# Patient Record
Sex: Male | Born: 1965 | Race: Black or African American | Hispanic: No | Smoking: Never smoker
Health system: Southern US, Community
[De-identification: ages and names within clinical notes are randomized; demographics above are authoritative.]

## PROBLEM LIST (undated history)

## (undated) DIAGNOSIS — J3089 Other allergic rhinitis: Secondary | ICD-10-CM

## (undated) DIAGNOSIS — R519 Headache, unspecified: Secondary | ICD-10-CM

## (undated) DIAGNOSIS — K759 Inflammatory liver disease, unspecified: Secondary | ICD-10-CM

## (undated) DIAGNOSIS — I10 Essential (primary) hypertension: Secondary | ICD-10-CM

## (undated) DIAGNOSIS — F419 Anxiety disorder, unspecified: Secondary | ICD-10-CM

## (undated) DIAGNOSIS — Z973 Presence of spectacles and contact lenses: Secondary | ICD-10-CM

## (undated) DIAGNOSIS — M791 Myalgia, unspecified site: Secondary | ICD-10-CM

## (undated) DIAGNOSIS — M1990 Unspecified osteoarthritis, unspecified site: Secondary | ICD-10-CM

## (undated) DIAGNOSIS — R42 Dizziness and giddiness: Secondary | ICD-10-CM

## (undated) DIAGNOSIS — E559 Vitamin D deficiency, unspecified: Secondary | ICD-10-CM

## (undated) DIAGNOSIS — E7800 Pure hypercholesterolemia, unspecified: Secondary | ICD-10-CM

## (undated) DIAGNOSIS — N529 Male erectile dysfunction, unspecified: Secondary | ICD-10-CM

## (undated) DIAGNOSIS — R1310 Dysphagia, unspecified: Secondary | ICD-10-CM

## (undated) DIAGNOSIS — M4800 Spinal stenosis, site unspecified: Secondary | ICD-10-CM

## (undated) DIAGNOSIS — R0602 Shortness of breath: Secondary | ICD-10-CM

## (undated) DIAGNOSIS — R079 Chest pain, unspecified: Secondary | ICD-10-CM

## (undated) DIAGNOSIS — G8929 Other chronic pain: Secondary | ICD-10-CM

## (undated) DIAGNOSIS — R51 Headache: Secondary | ICD-10-CM

## (undated) HISTORY — DX: Headache, unspecified: R51.9

## (undated) HISTORY — DX: Dysphagia, unspecified: R13.10

## (undated) HISTORY — DX: Male erectile dysfunction, unspecified: N52.9

## (undated) HISTORY — DX: Shortness of breath: R06.02

## (undated) HISTORY — DX: Essential (primary) hypertension: I10

## (undated) HISTORY — DX: Chest pain, unspecified: R07.9

## (undated) HISTORY — DX: Headache: R51

## (undated) HISTORY — DX: Other chronic pain: G89.29

## (undated) HISTORY — DX: Anxiety disorder, unspecified: F41.9

## (undated) HISTORY — DX: Spinal stenosis, site unspecified: M48.00

## (undated) HISTORY — DX: Myalgia, unspecified site: M79.10

## (undated) HISTORY — PX: BACK SURGERY: SHX140

## (undated) HISTORY — PX: KNEE SURGERY: SHX244

## (undated) HISTORY — PX: SHOULDER SURGERY: SHX246

## (undated) HISTORY — DX: Vitamin D deficiency, unspecified: E55.9

---

## 2014-07-18 ENCOUNTER — Ambulatory Visit (INDEPENDENT_AMBULATORY_CARE_PROVIDER_SITE_OTHER): Payer: Self-pay | Admitting: Emergency Medicine

## 2014-07-18 VITALS — BP 172/102 | HR 77 | Temp 98.1°F | Resp 18 | Ht 69.0 in | Wt 255.0 lb

## 2014-07-18 DIAGNOSIS — Z024 Encounter for examination for driving license: Secondary | ICD-10-CM

## 2014-07-18 DIAGNOSIS — Z021 Encounter for pre-employment examination: Secondary | ICD-10-CM

## 2014-07-18 NOTE — Progress Notes (Signed)
   Subjective:  Patient ID: Spencer Buckley, male    DOB: Apr 16, 1965  Age: 49 y.o. MRN: [id number]  CC: Annual Exam   HPI Spencer Buckley presents  DOT exam has a history of hypertension and is been out of medicine 3 months. Lost 40 pounds last months  History Spencer Buckley has a past medical history of Hypertension.   He has past surgical history that includes Shoulder surgery; Back surgery (Right); and Knee surgery.   His  family history includes Hyperlipidemia in his father; Hypertension in his father and mother.  He   reports that he has never smoked. He does not have any smokeless tobacco history on file. His alcohol and drug histories are not on file.  No outpatient prescriptions prior to visit.   No facility-administered medications prior to visit.    History   Social History  . Marital Status: Married    Spouse Name: N/A  . Number of Children: N/A  . Years of Education: N/A   Social History Main Topics  . Smoking status: Never Smoker   . Smokeless tobacco: Not on file  . Alcohol Use: Not on file  . Drug Use: Not on file  . Sexual Activity: Not on file   Other Topics Concern  . None   Social History Narrative  . None     Review of Systems  Review of systems noncontributory  Objective:  BP 172/102 mmHg  Pulse 77  Temp(Src) 98.1 F (36.7 C)  Resp 18  Ht 5\' 9"  (1.753 m)  Wt 255 lb (115.667 kg)  BMI 37.64 kg/m2  SpO2 98%  Physical Exam  Constitutional: He is oriented to person, place, and time. He appears well-developed and well-nourished. No distress.  HENT:  Head: Normocephalic and atraumatic.  Right Ear: External ear normal.  Left Ear: External ear normal.  Nose: Nose normal.  Eyes: Conjunctivae and EOM are normal. Pupils are equal, round, and reactive to light. No scleral icterus.  Neck: Normal range of motion. Neck supple. No tracheal deviation present.  Cardiovascular: Normal rate, regular rhythm and normal heart sounds.   Pulmonary/Chest:  Effort normal. No respiratory distress. He has no wheezes. He has no rales.  Abdominal: He exhibits no mass. There is no tenderness. There is no rebound and no guarding.  Musculoskeletal: He exhibits no edema.  Lymphadenopathy:    He has no cervical adenopathy.  Neurological: He is alert and oriented to person, place, and time. Coordination normal.  Skin: Skin is warm and dry. No rash noted.  Psychiatric: He has a normal mood and affect. His behavior is normal.      Assessment & Plan:   Spencer Buckley was seen today for annual exam.  Diagnoses and all orders for this visit:  Encounter for commercial driver medical examination (CDME)   Mr. Lorenz CoasterKeller does not currently have medications on file.  No orders of the defined types were placed in this encounter.    Appropriate red flag conditions were discussed with the patient as well as actions that should be taken.  Patient expressed his understanding.  Follow-up: Return if symptoms worsen or fail to improve.  Carmelina DaneAnderson, Mi Balla S, MD

## 2014-12-09 ENCOUNTER — Other Ambulatory Visit (HOSPITAL_COMMUNITY): Payer: Self-pay | Admitting: Neurological Surgery

## 2014-12-16 ENCOUNTER — Emergency Department (INDEPENDENT_AMBULATORY_CARE_PROVIDER_SITE_OTHER)
Admission: EM | Admit: 2014-12-16 | Discharge: 2014-12-16 | Disposition: A | Discharge status: Home / Self Care | Payer: BLUE CROSS/BLUE SHIELD | Source: Home / Self Care | Attending: Emergency Medicine | Admitting: Emergency Medicine

## 2014-12-16 ENCOUNTER — Encounter (HOSPITAL_COMMUNITY): Payer: Self-pay | Admitting: Emergency Medicine

## 2014-12-16 DIAGNOSIS — G43009 Migraine without aura, not intractable, without status migrainosus: Secondary | ICD-10-CM

## 2014-12-16 MED ORDER — KETOROLAC TROMETHAMINE 60 MG/2ML IM SOLN
60.0000 mg | Freq: Once | INTRAMUSCULAR | Status: AC
  Administered 2014-12-16: 60 mg via INTRAMUSCULAR

## 2014-12-16 MED ORDER — METOCLOPRAMIDE HCL 5 MG/ML IJ SOLN
INTRAMUSCULAR | Status: AC
  Filled 2014-12-16: qty 2

## 2014-12-16 MED ORDER — METOCLOPRAMIDE HCL 5 MG/ML IJ SOLN
10.0000 mg | Freq: Once | INTRAMUSCULAR | Status: AC
  Administered 2014-12-16: 10 mg via INTRAMUSCULAR

## 2014-12-16 MED ORDER — AZITHROMYCIN 250 MG PO TABS: ORAL_TABLET | ORAL | Status: DC

## 2014-12-16 MED ORDER — KETOROLAC TROMETHAMINE 60 MG/2ML IM SOLN
INTRAMUSCULAR | Status: AC
  Filled 2014-12-16: qty 2

## 2014-12-16 MED ORDER — DEXAMETHASONE SODIUM PHOSPHATE 10 MG/ML IJ SOLN
10.0000 mg | Freq: Once | INTRAMUSCULAR | Status: AC
  Administered 2014-12-16: 10 mg via INTRAMUSCULAR

## 2014-12-16 MED ORDER — DEXAMETHASONE SODIUM PHOSPHATE 10 MG/ML IJ SOLN
INTRAMUSCULAR | Status: AC
  Filled 2014-12-16: qty 1

## 2014-12-16 NOTE — ED Notes (Signed)
[facility] a [facility] of a headache that has been on going x 3 days.

## 2014-12-16 NOTE — ED Provider Notes (Signed)
CSN: [id number]     Arrival date & time 12/16/14  1756 History   First MD Initiated Contact with Patient 12/16/14 1833     Chief Complaint  Patient presents with  . Headache   (Consider location/radiation/quality/duration/timing/severity/associated sxs/prior Treatment) HPI  He is a 49 year old man here for evaluation of headache. He states the headache started Saturday night and has been persistent since then. It is a pounding pain above the left eye. There is a tender spot when he presses in the left eyebrow. He does report some photophobia and some nausea. No vomiting. No focal numbness, tingling, or weakness. He has a history of migraines when he was younger. He does also state he has had some postnasal drainage and nasal discharge the last day.  Past Medical History  Diagnosis Date  . Hypertension    Past Surgical History  Procedure Laterality Date  . Shoulder surgery    . Back surgery Right   . Knee surgery     Family History  Problem Relation Age of Onset  . Hypertension Mother   . Hypertension Father   . Hyperlipidemia Father    Social History  Substance Use Topics  . Smoking status: Never Smoker   . Smokeless tobacco: None  . Alcohol Use: None    Review of Systems As in history of present illness Allergies  Review of patient's allergies indicates no known allergies.  Home Medications   Prior to Admission medications   Medication Sig Start Date End Date Taking? Authorizing Provider  azithromycin (ZITHROMAX Z-PAK) 250 MG tablet Take 2 pills today, then 1 pill daily until gone. 12/16/14   Charm RingsErin J Lashawna Poche, MD   Meds Ordered and Administered this Visit   Medications  ketorolac (TORADOL) injection 60 mg (not administered)  metoCLOPramide (REGLAN) injection 10 mg (not administered)  dexamethasone (DECADRON) injection 10 mg (not administered)    BP 170/96 mmHg  Pulse 87  Temp(Src) 98.3 F (36.8 C) (Oral)  Resp 20  SpO2 99% No data found.   Physical Exam    Constitutional: He is oriented to person, place, and time. He appears well-developed and well-nourished. He appears distressed (appears somewhat uncomfortable).  HENT:  Head:    Eyes: Conjunctivae and EOM are normal. Pupils are equal, round, and reactive to light.  Neck: Neck supple.  Cardiovascular: Normal rate, regular rhythm and normal heart sounds.   No murmur heard. Pulmonary/Chest: Effort normal and breath sounds normal. No respiratory distress. He has no wheezes. He has no rales.  Neurological: He is alert and oriented to person, place, and time. No cranial nerve deficit. He exhibits normal muscle tone. Coordination normal.    ED Course  Procedures (including critical care time)  Labs Review Labs Reviewed - No data to display  Imaging Review No results found.    MDM   1. Migraine without aura and without status migrainosus, not intractable    History is most consistent with a migraine type headache. Treated with migraine cocktail here. Given some sinus tenderness and the drainage, prescription given for azithromycin. If his symptoms have not resolved the next 2 days, he will take the azithromycin. Did discuss his elevated blood pressure. He denies any history of hypertension. Will have him recheck his blood pressure at home when his headache has resolved.    Charm RingsErin J Jaliyah Fotheringham, MD 12/16/14 [phone]

## 2014-12-16 NOTE — Discharge Instructions (Signed)
We gave you some medicines to help your headache. Please go home and get some rest.  The headache should be gone when you wake up. If you continue to have drainage and sinus pressure, take the azithromycin. Please recheck your blood pressure at the drug store once her headache has resolved. If it remains over 140/90, please see your primary care doctor about starting medication. Follow-up as needed.

## 2015-01-28 ENCOUNTER — Encounter (HOSPITAL_COMMUNITY): Payer: Self-pay | Admitting: *Deleted

## 2015-01-28 ENCOUNTER — Emergency Department (INDEPENDENT_AMBULATORY_CARE_PROVIDER_SITE_OTHER): Payer: BLUE CROSS/BLUE SHIELD

## 2015-01-28 ENCOUNTER — Encounter (HOSPITAL_COMMUNITY): Payer: Self-pay | Admitting: Emergency Medicine

## 2015-01-28 ENCOUNTER — Emergency Department (HOSPITAL_COMMUNITY)
Admission: EM | Admit: 2015-01-28 | Discharge: 2015-01-28 | Disposition: A | Discharge status: Home / Self Care | Payer: BLUE CROSS/BLUE SHIELD | Attending: Emergency Medicine | Admitting: Emergency Medicine

## 2015-01-28 ENCOUNTER — Emergency Department (INDEPENDENT_AMBULATORY_CARE_PROVIDER_SITE_OTHER)
Admission: EM | Admit: 2015-01-28 | Discharge: 2015-01-28 | Disposition: A | Discharge status: Home / Self Care | Payer: BLUE CROSS/BLUE SHIELD | Source: Home / Self Care | Attending: Emergency Medicine | Admitting: Emergency Medicine

## 2015-01-28 DIAGNOSIS — R1031 Right lower quadrant pain: Secondary | ICD-10-CM

## 2015-01-28 DIAGNOSIS — I10 Essential (primary) hypertension: Secondary | ICD-10-CM | POA: Insufficient documentation

## 2015-01-28 DIAGNOSIS — K6289 Other specified diseases of anus and rectum: Secondary | ICD-10-CM

## 2015-01-28 DIAGNOSIS — R112 Nausea with vomiting, unspecified: Secondary | ICD-10-CM | POA: Diagnosis not present

## 2015-01-28 DIAGNOSIS — R109 Unspecified abdominal pain: Secondary | ICD-10-CM | POA: Diagnosis not present

## 2015-01-28 LAB — POCT URINALYSIS DIP (DEVICE)
Bilirubin Urine: NEGATIVE
GLUCOSE, UA: NEGATIVE mg/dL
Hgb urine dipstick: NEGATIVE
Ketones, ur: NEGATIVE mg/dL
LEUKOCYTES UA: NEGATIVE
NITRITE: NEGATIVE
PROTEIN: NEGATIVE mg/dL
Specific Gravity, Urine: >=1.03 (ref 1.005–1.030)
UROBILINOGEN UA: 0.2 mg/dL (ref 0.0–1.0)
pH: 5.5 (ref 5.0–8.0)

## 2015-01-28 LAB — CBC
HEMATOCRIT: 43.8 % (ref 39.0–52.0)
HEMOGLOBIN: 15 g/dL (ref 13.0–17.0)
MCH: 27.9 pg (ref 26.0–34.0)
MCHC: 34.2 g/dL (ref 30.0–36.0)
MCV: 81.6 fL (ref 78.0–100.0)
Platelets: 294 10*3/uL (ref 150–400)
RBC: 5.37 MIL/uL (ref 4.22–5.81)
RDW: 13.7 % (ref 11.5–15.5)
WBC: 7.4 10*3/uL (ref 4.0–10.5)

## 2015-01-28 LAB — COMPREHENSIVE METABOLIC PANEL
ALBUMIN: 3.6 g/dL (ref 3.5–5.0)
ALT: 76 U/L — ABNORMAL HIGH (ref 17–63)
ANION GAP: 14 (ref 5–15)
AST: 64 U/L — ABNORMAL HIGH (ref 15–41)
Alkaline Phosphatase: 63 U/L (ref 38–126)
BILIRUBIN TOTAL: 0.8 mg/dL (ref 0.3–1.2)
BUN: 9 mg/dL (ref 6–20)
CALCIUM: 9.4 mg/dL (ref 8.9–10.3)
CO2: 24 mmol/L (ref 22–32)
Chloride: 102 mmol/L (ref 101–111)
Creatinine, Ser: 1.06 mg/dL (ref 0.61–1.24)
GFR calc non Af Amer: >60 mL/min (ref >60)
GLUCOSE: 93 mg/dL (ref 65–99)
POTASSIUM: 3.7 mmol/L (ref 3.5–5.1)
SODIUM: 140 mmol/L (ref 135–145)
TOTAL PROTEIN: 6.3 g/dL — AB (ref 6.5–8.1)

## 2015-01-28 LAB — URINALYSIS, ROUTINE W REFLEX MICROSCOPIC
BILIRUBIN URINE: NEGATIVE
Glucose, UA: NEGATIVE mg/dL
Hgb urine dipstick: NEGATIVE
Ketones, ur: NEGATIVE mg/dL
Leukocytes, UA: NEGATIVE
NITRITE: NEGATIVE
PH: 5 (ref 5.0–8.0)
Protein, ur: NEGATIVE mg/dL

## 2015-01-28 LAB — LIPASE, BLOOD: Lipase: 25 U/L (ref 11–51)

## 2015-01-28 MED ORDER — OXYCODONE HCL 10 MG PO TABS: 10.0000 mg | ORAL_TABLET | ORAL | Status: DC | PRN

## 2015-01-28 NOTE — ED Notes (Signed)
Pt at desk stating he is leaving due to wait, encouraged to stay 

## 2015-01-28 NOTE — ED Notes (Signed)
The pt has had abd pain for 2-3 weeks with nv  [facility]

## 2015-01-28 NOTE — ED Notes (Signed)
Abdominal pain, vomiting and diarrhea.  2 1/2 weeks of symptoms.  Pain in abdomen, right side, buttocks and rectum.  Dry cough

## 2015-01-28 NOTE — ED Provider Notes (Signed)
CSN: [id number]     Arrival date & time 01/28/15  1710 History   First MD Initiated Contact with Patient 01/28/15 1838     Chief Complaint  Patient presents with  . Abdominal Pain   (Consider location/radiation/quality/duration/timing/severity/associated sxs/prior Treatment) HPI  He is a 50 year old man here for evaluation of abdominal pain. He states for the last 2.5-3 weeks he has been having nausea and vomiting.  About a week ago he developed some central abdominal pain. Over the last several days it has moved to the right lower quadrant. This is quite tender to touch. He is also had green watery stools for the last 2-3 weeks. He states these are starting to firm up now. He denies any blood in the stool. He has developed rectal pain. This is worse with bowel movements and coughing. He denies any fevers, but does report cold chills.  Past Medical History  Diagnosis Date  . Hypertension    Past Surgical History  Procedure Laterality Date  . Shoulder surgery    . Back surgery Right   . Knee surgery     Family History  Problem Relation Age of Onset  . Hypertension Mother   . Hypertension Father   . Hyperlipidemia Father    Social History  Substance Use Topics  . Smoking status: Never Smoker   . Smokeless tobacco: None  . Alcohol Use: 0.0 oz/week    0 Standard drinks or equivalent per week    Review of Systems As in history of present illness Allergies  Review of patient's allergies indicates no known allergies.  Home Medications   Prior to Admission medications   Medication Sig Start Date End Date Taking? Authorizing Provider  ibuprofen (ADVIL,MOTRIN) 400 MG tablet Take 400 mg by mouth every 6 (six) hours as needed.   Yes Historical Provider, MD  LOSARTAN POTASSIUM PO Take by mouth.   Yes Historical Provider, MD  oxycodone-acetaminophen (LYNOX) 10-300 MG tablet Take 1 tablet by mouth every 4 (four) hours as needed for pain.   Yes Historical Provider, MD  azithromycin  (ZITHROMAX Z-PAK) 250 MG tablet Take 2 pills today, then 1 pill daily until gone. Patient not taking: Reported on 01/28/2015 12/16/14   Charm Rings, MD  Oxycodone HCl 10 MG TABS Take 1 tablet (10 mg total) by mouth every 4 (four) hours as needed (pain). 01/28/15   Charm Rings, MD   Meds Ordered and Administered this Visit  Medications - No data to display  BP 150/90 mmHg  Pulse 64  Temp(Src) 97.6 F (36.4 C) (Oral)  Resp 17  SpO2 96% No data found.   Physical Exam  Constitutional: He is oriented to person, place, and time. He appears well-developed and well-nourished. No distress.  Cardiovascular: Normal rate, regular rhythm and normal heart sounds.   No murmur heard. Pulmonary/Chest: Effort normal and breath sounds normal. No respiratory distress. He has no wheezes. He has no rales.  Abdominal: Soft. Bowel sounds are normal. He exhibits no distension and no mass. There is tenderness (in right lower quadrant). There is no rebound and no guarding.  Jostling of the table does cause discomfort in the right lower quadrant.  Genitourinary:  He appears to have a healing anal fissure at 6:00. This is exquisitely tender.  Neurological: He is alert and oriented to person, place, and time.    ED Course  Procedures (including critical care time)  Labs Review Labs Reviewed  POCT URINALYSIS DIP (DEVICE)    Imaging Review  Dg Abd 2 Views  01/28/2015  CLINICAL DATA:  Right-sided abdominal pain EXAM: ABDOMEN - 2 VIEW COMPARISON:  None. FINDINGS: Supine and upright images were obtained. There is moderate stool throughout the colon. There is no bowel dilatation or air-fluid level suggesting obstruction. No free air. No abnormal calcifications. Lung bases clear. IMPRESSION: Moderate stool throughout colon.  Bowel gas pattern unremarkable. Electronically Signed   By: Bretta Bang III M.D.   On: 01/28/2015 19:36      MDM   1. RLQ abdominal pain   2. Rectal pain    I'm concerned for  appendicitis versus rectal abscess given his pain in the nausea and vomiting. His vital signs are stable. Discussed transfer options to the emergency room for additional evaluation with CT scan. He will have his wife take him to the ER by private vehicle.    Charm Rings, MD 01/28/15 [phone]

## 2015-01-28 NOTE — Discharge Instructions (Signed)
Please go to the emergency room for additional evaluation for possible appendicitis versus a rectal abscess.

## 2015-02-12 ENCOUNTER — Inpatient Hospital Stay (HOSPITAL_COMMUNITY): Admission: RE | Admit: 2015-02-12 | Payer: BLUE CROSS/BLUE SHIELD | Source: Ambulatory Visit

## 2015-02-20 ENCOUNTER — Ambulatory Visit: Admit: 2015-02-20 | Payer: BLUE CROSS/BLUE SHIELD | Admitting: Neurological Surgery

## 2015-02-20 SURGERY — LUMBAR LAMINECTOMY/DECOMPRESSION MICRODISCECTOMY 1 LEVEL
Anesthesia: General | Site: Back | Laterality: Right

## 2015-09-12 ENCOUNTER — Ambulatory Visit (HOSPITAL_COMMUNITY)
Admission: EM | Admit: 2015-09-12 | Discharge: 2015-09-12 | Disposition: A | Discharge status: Other Acute Inpatient Hospital | Payer: BLUE CROSS/BLUE SHIELD

## 2015-09-17 ENCOUNTER — Ambulatory Visit: Payer: Self-pay | Admitting: Allergy & Immunology

## 2015-09-21 ENCOUNTER — Encounter: Payer: Self-pay | Admitting: Cardiovascular Disease

## 2015-11-19 ENCOUNTER — Telehealth: Payer: Self-pay

## 2015-11-19 NOTE — Telephone Encounter (Signed)
NOTES SENT TO SCHEDULING.  °

## 2015-11-23 NOTE — Progress Notes (Signed)
  Cardiology Office Note   Date:  11/24/2015   ID:  Spencer Buckley, DOB 10/27/1965, MRN [id number]  PCP:  BECKER, ANNA G, PA  Cardiologist:   Charnika Herbst, MD   Chief Complaint  Patient presents with  . Establish Care  . Chest Pain    per pt, 4 weeks ago midsternal left sided CP on & off, sharp & SOB, ED visit, f/u with card doc, pain runs the scale from 1-10 & never goes away.   . right & left sided calf pain      History of Present Illness: Spencer Buckley is a 50 y.o. male who presents for evaluation of recurrent chest pain CRF;s Include HTN.  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 Associated with headache and dyspnea. Seen [facility] 11/12/15 had normal CXR , CTA abdomen and chest normal stress echo CPK up ? Muscular pain Also d/c on prednisone 20 mg tapering dose   Started on statin and beta blocker Seen at [place] urgent [facility] as well on 11/13 on Lexapro now   CPK 1846 troponin negative LDL 121 HDL 53 Tri 131   LFTls were elevated US with no gallbladder disease  CTA:  No aneurysm dissection or PE  Carotids plaque no stenosis   He continues to have daily pains. Mostly at night resting feels chest tightening And then hard to breath.   Past Medical History:  Diagnosis Date  . Anxiety   . Chest pain   . Chronic headaches   . Dysphagia   . ED (erectile dysfunction)   . Hypertension   . Myalgia   . SOB (shortness of breath)   . Spinal stenosis   . Vitamin D deficiency     Past Surgical History:  Procedure Laterality Date  . BACK SURGERY Right   . KNEE SURGERY    . SHOULDER SURGERY       Current Outpatient Prescriptions  Medication Sig Dispense Refill  . ALPRAZolam (XANAX) 0.25 MG tablet Take 0.25 mg by mouth at bedtime as needed for anxiety.    . Cholecalciferol (VITAMIN D3) 2000 units capsule Take 2,000 Units by mouth daily.    . gabapentin (NEURONTIN) 300 MG capsule Take 300 mg by mouth  daily.    . meloxicam (MOBIC) 15 MG tablet Take 15 mg by mouth daily as needed for pain.    . metoprolol tartrate (LOPRESSOR) 25 MG tablet Take 25 mg by mouth 2 (two) times daily.    . traZODone (DESYREL) 50 MG tablet Take 50 mg by mouth at bedtime as needed for sleep.    . valsartan (DIOVAN) 320 MG tablet Take 320 mg by mouth daily.     No current facility-administered medications for this visit.     Allergies:   Patient has no known allergies.    Social History:  The patient  reports that he has never smoked. He has never used smokeless tobacco. He reports that he drinks alcohol. He reports that he does not use drugs.   Family History:  The patient's family history includes Hyperlipidemia in his father; Hypertension in his father and mother.    ROS:  Please see the history of present illness.   Otherwise, review of systems are positive for none.   All other systems are reviewed and negative.    PHYSICAL EXAM: VS:  BP (!) 170/90 (BP Location: Right Arm, Patient Position: Sitting, Cuff Size: Large)   Pulse   74   Ht 5' 10" (1.778 m)   Wt 116.5 kg (256 lb 12.8 oz)   SpO2 97%   BMI 36.85 kg/m  , BMI Body mass index is 36.85 kg/m. Affect appropriate Healthy:  appears stated age HEENT: normal Neck supple with no adenopathy JVP normal no bruits no thyromegaly Lungs clear with no wheezing and good diaphragmatic motion Heart:  S1/S2 no murmur, no rub, gallop or click PMI normal Abdomen: benighn, BS positve, no tenderness, no AAA no bruit.  No HSM or HJR Distal pulses intact with no bruits No edema Neuro non-focal Skin warm and dry No muscular weakness    EKG:  SR rate 87 nonspecific ST/T wave changes LAE 09/24/15  11/24/15 SR rate 68 inferior lateral T wave inversions    Recent Labs: 01/28/2015: ALT 76; BUN 9; Creatinine, Ser 1.06; Hemoglobin 15.0; Platelets 294; Potassium 3.7; Sodium 140    Lipid Panel No results found for: CHOL, TRIG, HDL, CHOLHDL, VLDL, LDLCALC,  LDLDIRECT    Wt Readings from Last 3 Encounters:  11/24/15 116.5 kg (256 lb 12.8 oz)  01/28/15 117.3 kg (258 lb 9 oz)  07/18/14 115.7 kg (255 lb)      Other studies Reviewed: Additional studies/ records that were reviewed today include: Notes [facility] including Consults CT;s stress echo ECG labs and notes [facility] .    ASSESSMENT AND PLAN:  1.  Chest Pain doubt cardiac with normal stress echo However recurrent requiring hospital visits, abnormal ECG. Discussed options and favor diagnostic cath. Risks discussed willing to proceed. 11/27 right radial  Will check ESR , and routine pre cath laabs 2. LFTls will recheck He stopped statin and only has wine on rare occasion US at [facility] No obstructive GB disease  3. 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 No longer on prednison post d/c But did not feel that they helped    Current medicines are reviewed at length with the patient today.  The patient does not have concerns regarding medicines.  The following changes have been made:  no change  Labs/ tests ordered today include: Pre cath  Orders Placed This Encounter  Procedures  . Basic metabolic panel  . CBC with Differential/Platelet  . PTT  . INR/PT  . Sed Rate (ESR)  . Hepatic function panel  . CK Total (and CKMB)  . CK (Creatine Kinase)  . EKG 12-Lead     Disposition:   FU with primary post cath if normal      Signed, Ethelyne Erich, MD  11/24/2015 11:10 AM    [facility] [address], [place], [place]  [address] Phone: [phone]; Fax: [phone]  

## 2015-11-24 ENCOUNTER — Ambulatory Visit (INDEPENDENT_AMBULATORY_CARE_PROVIDER_SITE_OTHER): Payer: BLUE CROSS/BLUE SHIELD | Admitting: Cardiovascular Disease

## 2015-11-24 ENCOUNTER — Encounter (INDEPENDENT_AMBULATORY_CARE_PROVIDER_SITE_OTHER): Payer: Self-pay

## 2015-11-24 ENCOUNTER — Encounter: Payer: Self-pay | Admitting: Cardiovascular Disease

## 2015-11-24 VITALS — BP 170/90 | HR 74 | Ht 70.0 in | Wt 256.8 lb

## 2015-11-24 DIAGNOSIS — Z7689 Persons encountering health services in other specified circumstances: Secondary | ICD-10-CM | POA: Diagnosis not present

## 2015-11-24 DIAGNOSIS — R079 Chest pain, unspecified: Secondary | ICD-10-CM | POA: Diagnosis not present

## 2015-11-24 DIAGNOSIS — Z01812 Encounter for preprocedural laboratory examination: Secondary | ICD-10-CM

## 2015-11-24 LAB — CBC WITH DIFFERENTIAL/PLATELET
BASOS ABS: 0 {cells}/uL (ref 0–200)
Basophils Relative: 0 %
EOS ABS: 180 {cells}/uL (ref 15–500)
Eosinophils Relative: 2 %
HCT: 45.9 % (ref 38.5–50.0)
HEMOGLOBIN: 15.6 g/dL (ref 13.2–17.1)
LYMPHS ABS: 2340 {cells}/uL (ref 850–3900)
Lymphocytes Relative: 26 %
MCH: 27.6 pg (ref 27.0–33.0)
MCHC: 34 g/dL (ref 32.0–36.0)
MCV: 81.2 fL (ref 80.0–100.0)
MPV: 9 fL (ref 7.5–12.5)
Monocytes Absolute: 630 cells/uL (ref 200–950)
Monocytes Relative: 7 %
NEUTROS ABS: 5850 {cells}/uL (ref 1500–7800)
NEUTROS PCT: 65 %
Platelets: 381 10*3/uL (ref 140–400)
RBC: 5.65 MIL/uL (ref 4.20–5.80)
RDW: 14.5 % (ref 11.0–15.0)
WBC: 9 10*3/uL (ref 3.8–10.8)

## 2015-11-24 LAB — HEPATIC FUNCTION PANEL
ALK PHOS: 71 U/L (ref 40–115)
ALT: 51 U/L — ABNORMAL HIGH (ref 9–46)
AST: 37 U/L — AB (ref 10–35)
Albumin: 4.1 g/dL (ref 3.6–5.1)
BILIRUBIN DIRECT: 0.2 mg/dL (ref <=0.2)
BILIRUBIN INDIRECT: 0.8 mg/dL (ref 0.2–1.2)
BILIRUBIN TOTAL: 1 mg/dL (ref 0.2–1.2)
Total Protein: 6.7 g/dL (ref 6.1–8.1)

## 2015-11-24 LAB — BASIC METABOLIC PANEL
BUN: 9 mg/dL (ref 7–25)
CALCIUM: 9.3 mg/dL (ref 8.6–10.3)
CHLORIDE: 100 mmol/L (ref 98–110)
CO2: 25 mmol/L (ref 20–31)
CREATININE: 1.1 mg/dL (ref 0.70–1.33)
GLUCOSE: 89 mg/dL (ref 65–99)
Potassium: 4.3 mmol/L (ref 3.5–5.3)
SODIUM: 136 mmol/L (ref 135–146)

## 2015-11-24 NOTE — Patient Instructions (Addendum)
Medication Instructions:  Your physician recommends that you continue on your current medications as directed. Please refer to the Current Medication list given to you today.   Labwork: TODAY: BMET, CBC, PT, INR, Sed rate, LFTs, CPK  Testing/Procedures: Your physician has requested that you have a cardiac catheterization on Monday, November 30, 2015 with Dr. Eden EmmsNishan. Cardiac catheterization is used to diagnose and/or treat various heart conditions. Doctors may recommend this procedure for a number of different reasons. The most common reason is to evaluate chest pain. Chest pain can be a symptom of coronary artery disease (CAD), and cardiac catheterization can show whether plaque is narrowing or blocking your heart's arteries. This procedure is also used to evaluate the valves, as well as measure the blood flow and oxygen levels in different parts of your heart. For further information please visit [email or web address]. Please follow instruction sheet, as given.  Follow-Up: Your physician recommends that you schedule a follow-up appointment AS NEEDED pending catheterization results.   Any Other Special Instructions Will Be Listed Below (If Applicable).

## 2015-11-25 ENCOUNTER — Other Ambulatory Visit: Payer: Self-pay | Admitting: Cardiovascular Disease

## 2015-11-25 LAB — CK TOTAL AND CKMB (NOT AT ARMC)
CK, MB: 9.2 ng/mL — ABNORMAL HIGH (ref 0.0–5.0)
RELATIVE INDEX: 1.5 (ref 0.0–4.0)
Total CK: 619 U/L — ABNORMAL HIGH (ref 7–232)

## 2015-11-25 LAB — SEDIMENTATION RATE: SED RATE: 6 mm/h (ref 0–20)

## 2015-11-30 ENCOUNTER — Ambulatory Visit (HOSPITAL_COMMUNITY)
Admission: RE | Admit: 2015-11-30 | Discharge: 2015-11-30 | Disposition: A | Discharge status: Home / Self Care | Payer: BLUE CROSS/BLUE SHIELD | Source: Ambulatory Visit | Attending: Cardiovascular Disease | Admitting: Cardiovascular Disease

## 2015-11-30 ENCOUNTER — Encounter (HOSPITAL_COMMUNITY)
Admission: RE | Disposition: A | Discharge status: Home / Self Care | Payer: Self-pay | Source: Ambulatory Visit | Attending: Cardiovascular Disease

## 2015-11-30 DIAGNOSIS — R0789 Other chest pain: Secondary | ICD-10-CM | POA: Diagnosis not present

## 2015-11-30 DIAGNOSIS — F419 Anxiety disorder, unspecified: Secondary | ICD-10-CM | POA: Insufficient documentation

## 2015-11-30 DIAGNOSIS — M6282 Rhabdomyolysis: Secondary | ICD-10-CM | POA: Diagnosis not present

## 2015-11-30 DIAGNOSIS — Z79899 Other long term (current) drug therapy: Secondary | ICD-10-CM | POA: Insufficient documentation

## 2015-11-30 DIAGNOSIS — E559 Vitamin D deficiency, unspecified: Secondary | ICD-10-CM | POA: Diagnosis not present

## 2015-11-30 DIAGNOSIS — I10 Essential (primary) hypertension: Secondary | ICD-10-CM | POA: Insufficient documentation

## 2015-11-30 DIAGNOSIS — Z7982 Long term (current) use of aspirin: Secondary | ICD-10-CM | POA: Diagnosis not present

## 2015-11-30 DIAGNOSIS — R079 Chest pain, unspecified: Secondary | ICD-10-CM | POA: Diagnosis not present

## 2015-11-30 HISTORY — PX: CARDIAC CATHETERIZATION: SHX172

## 2015-11-30 SURGERY — LEFT HEART CATH AND CORONARY ANGIOGRAPHY
Anesthesia: LOCAL

## 2015-11-30 MED ORDER — VERAPAMIL HCL 2.5 MG/ML IV SOLN
INTRAVENOUS | Status: DC | PRN
  Administered 2015-11-30: 11:00:00 via INTRA_ARTERIAL

## 2015-11-30 MED ORDER — LIDOCAINE HCL (PF) 1 % IJ SOLN
INTRAMUSCULAR | Status: DC | PRN
  Administered 2015-11-30: 2 mL

## 2015-11-30 MED ORDER — SODIUM CHLORIDE 0.9% FLUSH: 3.0000 mL | INTRAVENOUS | Status: DC | PRN

## 2015-11-30 MED ORDER — HEPARIN (PORCINE) IN NACL 2-0.9 UNIT/ML-% IJ SOLN
INTRAMUSCULAR | Status: AC
  Filled 2015-11-30: qty 1000

## 2015-11-30 MED ORDER — SODIUM CHLORIDE 0.9 % IV SOLN: 250.0000 mL | INTRAVENOUS | Status: DC | PRN

## 2015-11-30 MED ORDER — LIDOCAINE HCL (PF) 1 % IJ SOLN
INTRAMUSCULAR | Status: AC
  Filled 2015-11-30: qty 30

## 2015-11-30 MED ORDER — FENTANYL CITRATE (PF) 100 MCG/2ML IJ SOLN
INTRAMUSCULAR | Status: AC
Start: 2015-11-30 — End: 2015-11-30
  Filled 2015-11-30: qty 2

## 2015-11-30 MED ORDER — SODIUM CHLORIDE 0.9% FLUSH: 3.0000 mL | Freq: Two times a day (BID) | INTRAVENOUS | Status: DC

## 2015-11-30 MED ORDER — SODIUM CHLORIDE 0.9 % WEIGHT BASED INFUSION: 1.0000 mL/kg/h | INTRAVENOUS | Status: DC

## 2015-11-30 MED ORDER — VERAPAMIL HCL 2.5 MG/ML IV SOLN
INTRAVENOUS | Status: AC
  Filled 2015-11-30: qty 2

## 2015-11-30 MED ORDER — ASPIRIN 81 MG PO CHEW
CHEWABLE_TABLET | ORAL | Status: AC
  Filled 2015-11-30: qty 1

## 2015-11-30 MED ORDER — HEPARIN SODIUM (PORCINE) 1000 UNIT/ML IJ SOLN
INTRAMUSCULAR | Status: DC | PRN
  Administered 2015-11-30: 5500 [IU] via INTRAVENOUS

## 2015-11-30 MED ORDER — FENTANYL CITRATE (PF) 100 MCG/2ML IJ SOLN
INTRAMUSCULAR | Status: DC | PRN
  Administered 2015-11-30: 25 ug via INTRAVENOUS

## 2015-11-30 MED ORDER — SODIUM CHLORIDE 0.9 % IV SOLN: INTRAVENOUS | Status: AC

## 2015-11-30 MED ORDER — SODIUM CHLORIDE 0.9 % WEIGHT BASED INFUSION
3.0000 mL/kg/h | INTRAVENOUS | Status: AC
  Administered 2015-11-30: 3 mL/kg/h via INTRAVENOUS

## 2015-11-30 MED ORDER — IOPAMIDOL (ISOVUE-370) INJECTION 76%
INTRAVENOUS | Status: AC
  Filled 2015-11-30: qty 100

## 2015-11-30 MED ORDER — MIDAZOLAM HCL 2 MG/2ML IJ SOLN
INTRAMUSCULAR | Status: AC
  Filled 2015-11-30: qty 2

## 2015-11-30 MED ORDER — HEPARIN SODIUM (PORCINE) 1000 UNIT/ML IJ SOLN
INTRAMUSCULAR | Status: AC
  Filled 2015-11-30: qty 1

## 2015-11-30 MED ORDER — HEPARIN (PORCINE) IN NACL 2-0.9 UNIT/ML-% IJ SOLN
INTRAMUSCULAR | Status: DC | PRN
  Administered 2015-11-30: 1000 mL

## 2015-11-30 MED ORDER — IOPAMIDOL (ISOVUE-370) INJECTION 76%
INTRAVENOUS | Status: DC | PRN
  Administered 2015-11-30: 85 mL via INTRA_ARTERIAL

## 2015-11-30 MED ORDER — ASPIRIN 81 MG PO CHEW
81.0000 mg | CHEWABLE_TABLET | ORAL | Status: AC
  Administered 2015-11-30: 81 mg via ORAL

## 2015-11-30 MED ORDER — MIDAZOLAM HCL 2 MG/2ML IJ SOLN
INTRAMUSCULAR | Status: DC | PRN
  Administered 2015-11-30 (×2): 1 mg via INTRAVENOUS
  Administered 2015-11-30: 2 mg via INTRAVENOUS

## 2015-11-30 SURGICAL SUPPLY — 12 items
CATH 5FR JL3.5 JR4 ANG PIG MP (CATHETERS) ×2 IMPLANT
COVER PRB 48X5XTLSCP FOLD TPE (BAG) ×1 IMPLANT
COVER PROBE 5X48 (BAG) ×1
DEVICE RAD COMP TR BAND LRG (VASCULAR PRODUCTS) ×2 IMPLANT
GLIDESHEATH SLEND SS 6F .021 (SHEATH) ×2 IMPLANT
GUIDEWIRE INQWIRE 1.5J.035X260 (WIRE) ×1 IMPLANT
INQWIRE 1.5J .035X260CM (WIRE) ×2
KIT HEART LEFT (KITS) ×2 IMPLANT
PACK CARDIAC CATHETERIZATION (CUSTOM PROCEDURE TRAY) ×2 IMPLANT
SYR MEDRAD MARK V 150ML (SYRINGE) ×2 IMPLANT
TRANSDUCER W/STOPCOCK (MISCELLANEOUS) ×2 IMPLANT
TUBING CIL FLEX 10 FLL-RA (TUBING) ×2 IMPLANT

## 2015-11-30 NOTE — Discharge Instructions (Signed)

## 2015-11-30 NOTE — H&P (View-Only) (Signed)
Cardiology Office Note   Date:  11/24/2015   ID:  Spencer Buckley, DOB [birth year], MRN [id number]  PCP:  Lois Huxley, PA  Cardiologist:   Jenkins Rouge, MD   Chief Complaint  Patient presents with  . Establish Care  . Chest Pain    per pt, 4 weeks ago midsternal left sided CP on & off, sharp & SOB, ED visit, f/u with card doc, pain runs the scale from 1-10 & never goes away.   . right & left sided calf pain      History of Present Illness: Spencer Buckley is a 50 y.o. male who presents for evaluation of recurrent chest pain CRF;s Include HTN.  Seen by primary 11/19/15 Complained of pain for a month after starting new Job at [employer]. Associated with headache and dyspnea. Seen [facility] 11/12/15 had normal CXR , CTA abdomen and chest normal stress echo CPK up ? Muscular pain Also d/c on prednisone 20 mg tapering dose   Started on statin and beta blocker Seen at [facility] urgent care as well on 11/13 on Lexapro now   CPK 1846 troponin negative LDL 121 HDL 53 Tri 131   LFTls were elevated [place] with no gallbladder disease  CTA:  No aneurysm dissection or PE  Carotids plaque no stenosis   He continues to have daily pains. Mostly at night resting feels chest tightening And then hard to breath.   Past Medical History:  Diagnosis Date  . Anxiety   . Chest pain   . Chronic headaches   . Dysphagia   . ED (erectile dysfunction)   . Hypertension   . Myalgia   . SOB (shortness of breath)   . Spinal stenosis   . Vitamin D deficiency     Past Surgical History:  Procedure Laterality Date  . BACK SURGERY Right   . KNEE SURGERY    . SHOULDER SURGERY       Current Outpatient Prescriptions  Medication Sig Dispense Refill  . ALPRAZolam (XANAX) 0.25 MG tablet Take 0.25 mg by mouth at bedtime as needed for anxiety.    . Cholecalciferol (VITAMIN D3) 2000 units capsule Take 2,000 Units by mouth daily.    Marland Kitchen gabapentin (NEURONTIN) 300 MG capsule Take 300 mg by mouth  daily.    . meloxicam (MOBIC) 15 MG tablet Take 15 mg by mouth daily as needed for pain.    . metoprolol tartrate (LOPRESSOR) 25 MG tablet Take 25 mg by mouth 2 (two) times daily.    . traZODone (DESYREL) 50 MG tablet Take 50 mg by mouth at bedtime as needed for sleep.    . valsartan (DIOVAN) 320 MG tablet Take 320 mg by mouth daily.     No current facility-administered medications for this visit.     Allergies:   Patient has no known allergies.    Social History:  The patient  reports that he has never smoked. He has never used smokeless tobacco. He reports that he drinks alcohol. He reports that he does not use drugs.   Family History:  The patient's family history includes Hyperlipidemia in his father; Hypertension in his father and mother.    ROS:  Please see the history of present illness.   Otherwise, review of systems are positive for none.   All other systems are reviewed and negative.    PHYSICAL EXAM: VS:  BP (!) 170/90 (BP Location: Right Arm, Patient Position: Sitting, Cuff Size: Large)   Pulse  74   Ht 5' 10" (1.778 m)   Wt 116.5 kg (256 lb 12.8 oz)   SpO2 97%   BMI 36.85 kg/m  , BMI Body mass index is 36.85 kg/m. Affect appropriate Healthy:  appears stated age 50: normal Neck supple with no adenopathy JVP normal no bruits no thyromegaly Lungs clear with no wheezing and good diaphragmatic motion Heart:  S1/S2 no murmur, no rub, gallop or click PMI normal Abdomen: benighn, BS positve, no tenderness, no AAA no bruit.  No HSM or HJR Distal pulses intact with no bruits No edema Neuro non-focal Skin warm and dry No muscular weakness    EKG:  SR rate 87 nonspecific ST/T wave changes LAE 09/24/15  11/24/15 SR rate 68 inferior lateral T wave inversions    Recent Labs: 01/28/2015: ALT 76; BUN 9; Creatinine, Ser 1.06; Hemoglobin 15.0; Platelets 294; Potassium 3.7; Sodium 140    Lipid Panel No results found for: CHOL, TRIG, HDL, CHOLHDL, VLDL, LDLCALC,  LDLDIRECT    Wt Readings from Last 3 Encounters:  11/24/15 116.5 kg (256 lb 12.8 oz)  01/28/15 117.3 kg (258 lb 9 oz)  07/18/14 115.7 kg (255 lb)      Other studies Reviewed: Additional studies/ records that were reviewed today include: Notes [facility] including Consults CT;s stress echo ECG labs and notes [facility] .    ASSESSMENT AND PLAN:  1.  Chest Pain doubt cardiac with normal stress echo However recurrent requiring hospital visits, abnormal ECG. Discussed options and favor diagnostic cath. Risks discussed willing to proceed. 11/27 right radial  Will check ESR , and routine pre cath laabs 2. LFTls will recheck He stopped statin and only has wine on rare occasion [place] at [facility] No obstructive GB disease  3. 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 No longer on prednison post d/c But did not feel that they helped    Current medicines are reviewed at length with the patient today.  The patient does not have concerns regarding medicines.  The following changes have been made:  no change  Labs/ tests ordered today include: Pre cath  Orders Placed This Encounter  Procedures  . Basic metabolic panel  . CBC with Differential/Platelet  . PTT  . INR/PT  . Sed Rate (ESR)  . Hepatic function panel  . CK Total (and CKMB)  . CK (Creatine Kinase)  . EKG 12-Lead     Disposition:   FU with primary post cath if normal      Signed, Jenkins Rouge, MD  11/24/2015 11:10 AM    [facility] [place], [place], [place]  [address] Phone: [phone]; Fax: [phone]

## 2015-11-30 NOTE — Progress Notes (Signed)
Pt's right radial wite WNL level zero.  Reviewed pt's DC instructions and pt verbalized understanding.  He stated his job was very physical and required him to lift, push, and pull so I called Dr Eden EmmsNishan who stated to give him  A note to return to work on Thursday.  Dr Eden EmmsNishan also stated to let the patient know his nurse would contact him regarding a follow up visit and I let the patient know that and he verbalized understanding.  I gave the patient his note for work as well as his copy of DC instructions and pt DC home with family.

## 2015-11-30 NOTE — Research (Signed)
CADLAD Informed Consent   Subject Name: Spencer Buckley  Subject met inclusion and exclusion criteria.  The informed consent form, study requirements and expectations were reviewed with the subject and questions and concerns were addressed prior to the signing of the consent form.  The subject verbalized understanding of the trail requirements.  The subject agreed to participate in the CADLAD trial and signed the informed consent.  The informed consent was obtained prior to performance of any protocol-specific procedures for the subject.  A copy of the signed informed consent was given to the subject and a copy was placed in the subject's medical record.  Sandie Ano 11/30/2015, 9:40

## 2015-11-30 NOTE — Interval H&P Note (Signed)
History and Physical Interval Note:  11/30/2015 8:38 AM  Spencer Buckley  has presented today for surgery, with the diagnosis of cp  The various methods of treatment have been discussed with the patient and family. After consideration of risks, benefits and other options for treatment, the patient has consented to  Procedure(s): Left Heart Cath and Coronary Angiography (N/A) as a surgical intervention .  The patient's history has been reviewed, patient examined, no change in status, stable for surgery.  I have reviewed the patient's chart and labs.  Questions were answered to the patient's satisfaction.     Charlton HawsPeter Radwan Cowley

## 2015-12-01 ENCOUNTER — Encounter (HOSPITAL_COMMUNITY): Payer: Self-pay | Admitting: Cardiovascular Disease

## 2015-12-02 ENCOUNTER — Other Ambulatory Visit: Payer: Self-pay | Admitting: Family Medicine

## 2015-12-02 DIAGNOSIS — M545 Low back pain: Secondary | ICD-10-CM

## 2015-12-02 DIAGNOSIS — M4807 Spinal stenosis, lumbosacral region: Secondary | ICD-10-CM

## 2015-12-02 DIAGNOSIS — M79604 Pain in right leg: Secondary | ICD-10-CM

## 2015-12-02 DIAGNOSIS — M79605 Pain in left leg: Secondary | ICD-10-CM

## 2015-12-03 LAB — APTT

## 2015-12-03 LAB — PROTIME-INR

## 2015-12-08 ENCOUNTER — Telehealth: Payer: Self-pay | Admitting: Cardiovascular Disease

## 2015-12-08 NOTE — Telephone Encounter (Signed)
New message ° ° °Pt verbalized that she is returning call to rn  °

## 2015-12-08 NOTE — Telephone Encounter (Signed)
Called patient with lab results.  

## 2015-12-09 ENCOUNTER — Telehealth: Payer: Self-pay | Admitting: Cardiovascular Disease

## 2015-12-09 DIAGNOSIS — R945 Abnormal results of liver function studies: Principal | ICD-10-CM

## 2015-12-09 DIAGNOSIS — R7989 Other specified abnormal findings of blood chemistry: Secondary | ICD-10-CM

## 2015-12-09 DIAGNOSIS — R748 Abnormal levels of other serum enzymes: Secondary | ICD-10-CM

## 2015-12-09 NOTE — Telephone Encounter (Signed)
Patient stated he had contacted his PCP, but they did not receive his results. Patient had a hard time explaining his results to his PCP. Will send another copy to his PCP. Will put in order for referral. Patient verbalized understanding.  Notes Recorded by Wendall StadePeter C Nishan, MD on 12/07/2015 at 2:37 PM EST Patient has elevated LFTls and CPK with no CAD at cath  Needs f/u primary rheumatology for ? Autoimmune disease causing rhabomyolysis

## 2015-12-09 NOTE — Telephone Encounter (Signed)
New Message  Pt voiced calling in regards to a referral to a rheumatologist.  Please f/u with pt

## 2015-12-10 NOTE — Addendum Note (Signed)
Addended by: Virl AxePATE INGALLS, Timm Bonenberger L on: 12/10/2015 11:52 AM   Modules accepted: Orders

## 2015-12-12 ENCOUNTER — Ambulatory Visit
Admission: RE | Admit: 2015-12-12 | Discharge: 2015-12-12 | Disposition: A | Discharge status: Home / Self Care | Payer: BLUE CROSS/BLUE SHIELD | Source: Ambulatory Visit | Attending: Family Medicine | Admitting: Family Medicine

## 2015-12-12 DIAGNOSIS — M545 Low back pain: Secondary | ICD-10-CM

## 2015-12-12 DIAGNOSIS — M4807 Spinal stenosis, lumbosacral region: Secondary | ICD-10-CM

## 2015-12-12 DIAGNOSIS — M79604 Pain in right leg: Secondary | ICD-10-CM

## 2015-12-12 DIAGNOSIS — M79605 Pain in left leg: Secondary | ICD-10-CM

## 2015-12-16 ENCOUNTER — Encounter: Payer: Self-pay | Admitting: *Deleted

## 2015-12-16 ENCOUNTER — Ambulatory Visit (INDEPENDENT_AMBULATORY_CARE_PROVIDER_SITE_OTHER): Payer: BLUE CROSS/BLUE SHIELD | Admitting: Pulmonary Disease

## 2015-12-16 ENCOUNTER — Encounter: Payer: Self-pay | Admitting: Pulmonary Disease

## 2015-12-16 DIAGNOSIS — R0602 Shortness of breath: Secondary | ICD-10-CM | POA: Diagnosis not present

## 2015-12-16 DIAGNOSIS — R0600 Dyspnea, unspecified: Secondary | ICD-10-CM | POA: Insufficient documentation

## 2015-12-16 LAB — NITRIC OXIDE: NITRIC OXIDE: 45

## 2015-12-16 MED ORDER — FLUTICASONE FUROATE-VILANTEROL 200-25 MCG/INH IN AEPB: 1.0000 | INHALATION_SPRAY | Freq: Every day | RESPIRATORY_TRACT | 3 refills | Status: DC

## 2015-12-16 MED ORDER — ALBUTEROL SULFATE HFA 108 (90 BASE) MCG/ACT IN AERS: 2.0000 | INHALATION_SPRAY | Freq: Four times a day (QID) | RESPIRATORY_TRACT | 3 refills | Status: AC | PRN

## 2015-12-16 MED ORDER — PREDNISONE 10 MG PO TABS: ORAL_TABLET | ORAL | 0 refills | Status: DC

## 2015-12-16 NOTE — Progress Notes (Signed)
Dionicia AblerWilton Tuman    [id number]    12-07-1965  Primary Care Physician:BECKER, Ann MakiANNA G, PA  Referring Physician: Wilfrid LundAnna G Becker, PA [address] [place] [id number]  Chief complaint:  Consult for dyspnea, chest tightness.  HPI: Mr. Lorenz CoasterKeller is a 50 year old with past medical history of childhood asthma. 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 He is unable to specify what each one is.  He has symptoms of daily nighttime awakenings. He denies any cough, sputum production, fevers, chills. He has worsening symptoms every time he goes to work. He has been recommended a 4 hr restriction at work and Monday Tuesday and Wednesday schedule. [place] and reports that the symptoms were better when he was away from work. He has history of childhood asthma and atopic allergy but the symptoms improved in adulthood. He has been given 2 weeks of Advair and a prednisone taper a couple of months ago. He cannot tell if this made any difference in his symptoms.   He was [facility] with a negative CTA and a stress test. He underwent a cardiac cath on 11/24/15 which showed clean coronaries. Also noted to have elevated LFTs and CPK. He has been referred to rheumatology for evaluation of autoimmune disease, rhabdomyolysis.  Outpatient Encounter Prescriptions as of 12/16/2015  Medication Sig  . aspirin EC 81 MG tablet Take 162 mg by mouth daily.  . Cholecalciferol (VITAMIN D3) 2000 units capsule Take 2,000 Units by mouth daily.   Marland Kitchen. escitalopram (LEXAPRO) 10 MG tablet Take 10 mg by mouth daily.  Marland Kitchen. gabapentin (NEURONTIN) 300 MG capsule Take 300 mg by mouth 2 (two) times daily as needed (pain).   . meloxicam (MOBIC) 15 MG  tablet Take 15 mg by mouth daily as needed for pain.  . metoprolol tartrate (LOPRESSOR) 25 MG tablet Take 25 mg by mouth 2 (two) times daily.  . valsartan (DIOVAN) 320 MG tablet Take 320 mg by mouth daily.  . traZODone (DESYREL) 50 MG tablet Take 50 mg by mouth at bedtime as needed for sleep.  . [DISCONTINUED] ALPRAZolam (XANAX) 0.25 MG tablet Take 0.25 mg by mouth at bedtime as needed for anxiety.   No facility-administered encounter medications on file as of 12/16/2015.     Allergies as of 12/16/2015  . (No Known Allergies)    Past Medical History:  Diagnosis Date  . Anxiety   . Chest pain   . Chronic headaches   . Dysphagia   . ED (erectile dysfunction)   . Hypertension   . Myalgia   . SOB (shortness of breath)   . Spinal stenosis   . Vitamin D deficiency     Past Surgical History:  Procedure Laterality Date  . BACK SURGERY Right   . CARDIAC CATHETERIZATION N/A 11/30/2015   Procedure: Left Heart Cath and Coronary Angiography;  Surgeon: Wendall StadePeter C Nishan, MD;  [facility];  Service: Cardiovascular;  Laterality: N/A;  . KNEE SURGERY    . SHOULDER SURGERY      Family History  Problem Relation Age of Onset  . Hypertension Mother   . Hypertension Father   . Hyperlipidemia Father  Social History   Social History  . Marital status: Married    Spouse name: N/A  . Number of children: N/A  . Years of education: N/A   Occupational History  . Not on file.   Social History Main Topics  . Smoking status: Never Smoker  . Smokeless tobacco: Never Used  . Alcohol use 0.0 oz/week  . Drug use: No  . Sexual activity: Not on file   Other Topics Concern  . Not on file   Social History Narrative  . No narrative on file   Review of systems: Review of Systems  Constitutional: Negative for fever and chills.  HENT: Negative.   Eyes: Negative for blurred vision.  Respiratory: as per HPI  Cardiovascular: Negative for chest pain and palpitations.    Gastrointestinal: Negative for vomiting, diarrhea, blood per rectum. Genitourinary: Negative for dysuria, urgency, frequency and hematuria.  Musculoskeletal: Negative for myalgias, back pain and joint pain.  Skin: Negative for itching and rash.  Neurological: Negative for dizziness, tremors, focal weakness, seizures and loss of consciousness.  Endo/Heme/Allergies: Negative for environmental allergies.  Psychiatric/Behavioral: Negative for depression, suicidal ideas and hallucinations.  All other systems reviewed and are negative.  Physical Exam: Blood pressure 132/80, pulse 72, height 5\' 10"  (1.778 m), weight 254 lb 12.8 oz (115.6 kg), SpO2 98 %. Gen:      No acute distress HEENT:  EOMI, sclera anicteric Neck:     No masses; no thyromegaly Lungs:    Clear to auscultation bilaterally; normal respiratory effort CV:         Regular rate and rhythm; no murmurs Abd:      + bowel sounds; soft, non-tender; no palpable masses, no distension Ext:    No edema; adequate peripheral perfusion Skin:      Warm and dry; no rash Neuro: alert and oriented x 3 Psych: normal mood and affect  Data Reviewed: Stress treadmill 11/13/15- Negative. CTA 11/12/15- No PE, no abnormal lung findings. Chest x-ray 11/12/15-no acute cardio pulmonary abnormality Abdominal ultrasound 11/13/15-hepatic steatosis. Cardiac cath 11/30/15- Normal coronaries Images reviewed  FENO 12/16/15- 45  Assessment:  #1 Eval for dyspnea, wheezing. 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 He has history of childhood asthma and atopic allergy.  I have advised him to limit exposure and change jobs if possible. He'll need to be on long-term controller medication as he has airway inflammation, shown by elevated FENO. I'll give him a short prednisone taper and start him on Breo and albuterol rescue inhaler. He'll get scheduled for pulmonary function tests, CBC differential and a blood  allergy profile. If his symptoms continue unabated then he may need a referral to occupational medicine.  #2 Elevated Liver enzymes and CKs This is of unclear etiology. I'm not sure if this is related to his lung issues. He has already been referred to rheumatology for further evaluation of any autoimmune process  Plan/Recommendations: - Start Breo, albuterol rescue inhaler - Pred taper starting at 40 mg. Reduce dose by 10 mg every 4 days - CBC with diff, blood allergy profile - PFTs  Chilton GreathousePraveen Atsushi Yom MD [facility] Pager [phone] 12/16/2015, 12:09 PM  CC: Wilfrid LundBecker, Anna G, PA

## 2015-12-16 NOTE — Patient Instructions (Signed)
We started you on Breo inhaler and albuterol rescue inhaler We give her prednisone taper starting at 40 mg. Reduce dose by 10 mg every 4 days. Will check a CBC with differential and a blood allergy profile. He'll be scheduled for pulmonary function tests.  Return to clinic in 1-2 months.

## 2015-12-29 ENCOUNTER — Encounter (HOSPITAL_COMMUNITY): Payer: Self-pay | Admitting: Family Medicine

## 2015-12-29 ENCOUNTER — Ambulatory Visit (INDEPENDENT_AMBULATORY_CARE_PROVIDER_SITE_OTHER): Payer: BLUE CROSS/BLUE SHIELD

## 2015-12-29 ENCOUNTER — Ambulatory Visit (HOSPITAL_COMMUNITY)
Admission: EM | Admit: 2015-12-29 | Discharge: 2015-12-29 | Disposition: A | Discharge status: Home / Self Care | Payer: BLUE CROSS/BLUE SHIELD | Attending: Family Medicine | Admitting: Family Medicine

## 2015-12-29 DIAGNOSIS — S39012A Strain of muscle, fascia and tendon of lower back, initial encounter: Secondary | ICD-10-CM

## 2015-12-29 MED ORDER — MELOXICAM 7.5 MG PO TABS: 7.5000 mg | ORAL_TABLET | Freq: Every day | ORAL | 0 refills | Status: DC

## 2015-12-29 MED ORDER — CYCLOBENZAPRINE HCL 5 MG PO TABS: 5.0000 mg | ORAL_TABLET | Freq: Three times a day (TID) | ORAL | 0 refills | Status: DC | PRN
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
## 2015-12-29 NOTE — Discharge Instructions (Signed)
Usually these kinds of injuries take a week to 10 days to resolve. I am prescribing a muscle relaxer and an anti-inflammatory medication to help you cope with the soreness. Please follow-up with your primary care doctor in a week if the symptoms are persisting.

## 2016-01-05 ENCOUNTER — Ambulatory Visit: Payer: BLUE CROSS/BLUE SHIELD | Admitting: Internal Medicine

## 2016-01-06 ENCOUNTER — Ambulatory Visit (INDEPENDENT_AMBULATORY_CARE_PROVIDER_SITE_OTHER): Payer: BLUE CROSS/BLUE SHIELD | Admitting: Internal Medicine

## 2016-01-06 ENCOUNTER — Other Ambulatory Visit (INDEPENDENT_AMBULATORY_CARE_PROVIDER_SITE_OTHER): Payer: BLUE CROSS/BLUE SHIELD

## 2016-01-06 ENCOUNTER — Encounter: Payer: Self-pay | Admitting: Internal Medicine

## 2016-01-06 VITALS — BP 170/80 | HR 81 | Ht 70.0 in | Wt 254.0 lb

## 2016-01-06 DIAGNOSIS — Z579 Occupational exposure to unspecified risk factor: Secondary | ICD-10-CM | POA: Diagnosis not present

## 2016-01-06 DIAGNOSIS — D869 Sarcoidosis, unspecified: Secondary | ICD-10-CM

## 2016-01-06 DIAGNOSIS — J45901 Unspecified asthma with (acute) exacerbation: Secondary | ICD-10-CM | POA: Insufficient documentation

## 2016-01-06 DIAGNOSIS — J4541 Moderate persistent asthma with (acute) exacerbation: Secondary | ICD-10-CM

## 2016-01-06 DIAGNOSIS — R0789 Other chest pain: Secondary | ICD-10-CM | POA: Diagnosis not present

## 2016-01-06 LAB — CBC WITH DIFFERENTIAL/PLATELET
BASOS PCT: 0.3 % (ref 0.0–3.0)
Basophils Absolute: 0 10*3/uL (ref 0.0–0.1)
EOS ABS: 0.1 10*3/uL (ref 0.0–0.7)
EOS PCT: 1.3 % (ref 0.0–5.0)
HEMATOCRIT: 45.8 % (ref 39.0–52.0)
HEMOGLOBIN: 15.7 g/dL (ref 13.0–17.0)
LYMPHS PCT: 19.4 % (ref 12.0–46.0)
Lymphs Abs: 1.6 10*3/uL (ref 0.7–4.0)
MCHC: 34.3 g/dL (ref 30.0–36.0)
MCV: 83.3 fl (ref 78.0–100.0)
Monocytes Absolute: 0.7 10*3/uL (ref 0.1–1.0)
Monocytes Relative: 8.2 % (ref 3.0–12.0)
NEUTROS ABS: 5.8 10*3/uL (ref 1.4–7.7)
Neutrophils Relative %: 70.8 % (ref 43.0–77.0)
PLATELETS: 310 10*3/uL (ref 150.0–400.0)
RBC: 5.51 Mil/uL (ref 4.22–5.81)
RDW: 14.8 % (ref 11.5–15.5)
WBC: 8.2 10*3/uL (ref 4.0–10.5)

## 2016-01-06 LAB — SEDIMENTATION RATE: Sed Rate: 46 mm/hr — ABNORMAL HIGH (ref 0–20)

## 2016-01-06 LAB — ANGIOTENSIN CONVERTING ENZYME: Angiotensin-Converting Enzyme: 9 U/L (ref 9–67)

## 2016-01-06 MED ORDER — LEVALBUTEROL HCL 0.63 MG/3ML IN NEBU
0.6300 mg | INHALATION_SOLUTION | Freq: Once | RESPIRATORY_TRACT | Status: AC
  Administered 2016-01-06: 0.63 mg via RESPIRATORY_TRACT

## 2016-01-06 MED ORDER — TRAMADOL HCL 50 MG PO TABS: ORAL_TABLET | ORAL | 0 refills | Status: DC

## 2016-01-06 MED ORDER — METHYLPREDNISOLONE ACETATE 80 MG/ML IJ SUSP
80.0000 mg | Freq: Once | INTRAMUSCULAR | Status: AC
  Administered 2016-01-06: 80 mg via INTRAMUSCULAR

## 2016-01-06 MED ORDER — FLUTICASONE-UMECLIDIN-VILANT 100-62.5-25 MCG/INH IN AEPB: 1.0000 | INHALATION_SPRAY | Freq: Every day | RESPIRATORY_TRACT | 0 refills | Status: AC
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
## 2016-01-06 NOTE — Assessment & Plan Note (Signed)
Recent chest wall pain suggests nerve root irritation mid thoracic spine with radiation around the left side of chest the sternum, aggravated by hard coughing. No rash. Plan-tramadol, Delsym

## 2016-01-06 NOTE — Assessment & Plan Note (Signed)
History would be consistent with an aggravated occupational asthma related to irritant exposures at work. Now further aggravated by nonspecific triggers including cold air and strong odors. Unlikely this will be resolved without removing himself from that workplace exposure. He will need to stay out of work while this settles down, pending scheduled reappointment with Dr. Isaiah SergeMannam here later this month. I doubt sarcoid but because he asks, we will check ACE level. Also need to get the CT scan report and disc from November study at [facility]. Plan-lab for ACE level. Try tramadol for chest pain and cough. Can also use Delsym. May need stronger cough suppressant. Try sample Trelegy inhaler instead of Breo.

## 2016-01-06 NOTE — Patient Instructions (Addendum)
Order- staff- please request disk and report of CT chest from [facility] November 2017 ,  Order- lab- ACE level, sed rate, ANA, Allergy profile, CBC w diff Dx sarcoid  Script printed for tramadol for cough and pain     Try 1-2 tabs every 8 hours if needed.   May also take otc Delsym.  Sample x 2 Trelegy Ellipta      Inhale 1 puff then rinse mouth, once daily  Make next available return appointment with Dr Isaiah SergeMannam  I recommend you consider finding alternative employment without irritant smells and exposures that bother your breathing.  Neb xop 0.63    Dx occupational asthma  Depo 80

## 2016-01-07 ENCOUNTER — Telehealth: Payer: Self-pay | Admitting: Pulmonary Disease

## 2016-01-07 LAB — RESPIRATORY ALLERGY PROFILE REGION II ~~LOC~~
ALLERGEN, CEDAR TREE, T6: 0.11 kU/L — AB
ALLERGEN, D PTERNOYSSINUS, D1: 11.9 kU/L — AB
ALLERGEN, MOUSE U PROTEIN, E72: 6.8 kU/L — AB
Allergen, A. alternata, m6: <0.1 kU/L
Allergen, C. Herbarum, M2: <0.1 kU/L
Allergen, Comm Silver Birch, t9: <0.1 kU/L
Allergen, Cottonwood, t14: <0.1 kU/L
Allergen, Mulberry, t76: <0.1 kU/L
Allergen, P. notatum, m1: <0.1 kU/L
Aspergillus fumigatus, m3: <0.1 kU/L
Bermuda Grass: 0.84 kU/L — ABNORMAL HIGH
Box Elder IgE: 2.1 kU/L — ABNORMAL HIGH
CAT DANDER: 52.1 kU/L — AB
Cockroach: 3.68 kU/L — ABNORMAL HIGH
Common Ragweed: <0.1 kU/L
D. FARINAE: 15.5 kU/L — AB
DOG DANDER: 69.9 kU/L — AB
IgE (Immunoglobulin E), Serum: 1426 kU/L — ABNORMAL HIGH (ref <115)
JOHNSON GRASS: 1.19 kU/L — AB
Rough Pigweed  IgE: <0.1 kU/L
TIMOTHY GRASS: 1.52 kU/L — AB

## 2016-01-07 LAB — ANA: ANA: NEGATIVE

## 2016-01-07 NOTE — Telephone Encounter (Signed)
lmtcb for Lindsay.  

## 2016-01-08 NOTE — Telephone Encounter (Signed)
Called and spoke to StaffordLindsay with Short Term Disability office and she is requesting last OV, pending OV, and return to work date. Called and spoke to pt. Pt states he is ok with us giving his medical information to the disability office. Lillia AbedLindsay states she contacted medical records and they informed her to contact us because Lillia AbedLindsay is not needing records, just dates.   Dr. Isaiah SergeMannam please advise if you have a date in mind that pt can return to work. Thanks.

## 2016-01-12 NOTE — Telephone Encounter (Signed)
Spoke with Lillia AbedLindsay at disability office and provider her with below recommendations.  Lillia AbedLindsay also asked for office visit notes and other paperwork- I provided her the [facility] for this as this a disability case.  Lillia AbedLindsay is also asking for PM to provide reasoning why pt is to remain out of work (more specific than occupational related asthma), and his restrictions.  PM please advise.  Thanks!

## 2016-01-12 NOTE — Telephone Encounter (Signed)
It would be better if he does not return to the same place of work if possible as he likely has occupational related asthma. I would ask him to stay out of work till feb 26th when he can be reassessed during the clinic visit.

## 2016-01-19 ENCOUNTER — Ambulatory Visit (INDEPENDENT_AMBULATORY_CARE_PROVIDER_SITE_OTHER)
Admission: RE | Admit: 2016-01-19 | Discharge: 2016-01-19 | Disposition: A | Discharge status: Home / Self Care | Payer: BLUE CROSS/BLUE SHIELD | Source: Ambulatory Visit | Attending: Pulmonary Disease | Admitting: Pulmonary Disease

## 2016-01-19 ENCOUNTER — Other Ambulatory Visit: Payer: BLUE CROSS/BLUE SHIELD

## 2016-01-19 ENCOUNTER — Ambulatory Visit (INDEPENDENT_AMBULATORY_CARE_PROVIDER_SITE_OTHER): Payer: BLUE CROSS/BLUE SHIELD | Admitting: Pulmonary Disease

## 2016-01-19 ENCOUNTER — Encounter: Payer: Self-pay | Admitting: Pulmonary Disease

## 2016-01-19 VITALS — BP 164/100 | HR 91 | Ht 70.0 in | Wt 263.6 lb

## 2016-01-19 DIAGNOSIS — R0789 Other chest pain: Secondary | ICD-10-CM

## 2016-01-19 DIAGNOSIS — R0602 Shortness of breath: Secondary | ICD-10-CM | POA: Diagnosis not present

## 2016-01-19 LAB — NITRIC OXIDE: Nitric Oxide: 11
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
## 2016-01-19 NOTE — Patient Instructions (Addendum)
We'll get a chest x-ray today to evaluate for the atypical chest pain Continue using the Trelegy inhaler. We'll send a prescription for that  Return to clinic in 1 month

## 2016-01-26 ENCOUNTER — Telehealth: Payer: Self-pay | Admitting: Pulmonary Disease

## 2016-01-26 NOTE — Telephone Encounter (Signed)
LMTCB

## 2016-01-27 ENCOUNTER — Telehealth: Payer: Self-pay

## 2016-01-27 MED ORDER — PREDNISONE 10 MG PO TABS: ORAL_TABLET | ORAL | 0 refills | Status: DC

## 2016-01-27 NOTE — Progress Notes (Signed)
Spoke with patient about CXR results. Pt did not have any further questions about results, but did discuss his cough. Please refer to telephone note on 1/24. Nothing further needed.

## 2016-01-27 NOTE — Telephone Encounter (Signed)
Spoke with pt, aware of recs.  rx sent to preferred pharmacy.  Nothing further needed.  

## 2016-01-27 NOTE — Telephone Encounter (Signed)
OK to give a pred taper. Starting at 60 mg. Reduce dose by 10 mg every 3 days

## 2016-01-27 NOTE — Telephone Encounter (Signed)
Called pt for xray results. Pt stated that he has a dry cough that causes chest pain. He also stated he had SOB that is transient, but more pronounced this week due to his hyperventilation as a result of his brother passing. Pt requested a pred taper to help with cough. Please review.    Dr. Isaiah SergeMannam please advise.

## 2016-01-28 NOTE — Telephone Encounter (Signed)
Called and spoke with pt and he stated that PM will need to call to update his diability forms for his job.  He stated that we will need to call [phone] and update them on his status of being out of work.  PM please advise. thanks

## 2016-01-29 ENCOUNTER — Telehealth: Payer: Self-pay | Admitting: Pulmonary Disease

## 2016-01-29 ENCOUNTER — Encounter: Payer: Self-pay | Admitting: Emergency Medicine

## 2016-01-29 NOTE — Telephone Encounter (Signed)
PM  Please Advise-   Pt. Came into the office and stated that you took him out of work until his next appointment which is 02/29/16. We just wanted to clarify if that was correct and if it was ok to write that letter. Below was your assessment.    Assessment:  #1 Eval for dyspnea, wheezing. 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 He has history of childhood asthma and atopic allergy. I have advised him to limit exposure and change jobs if possible. He is currently on disability.  [facility] which has been switched to Trelegy which helped somewhat with his symptoms. FENO in the office today shows normalization and I do not believe he'll require another course of steroids. His blood allergy profiles show multiple allergens including dust mites, cat, dog, pollen, grasses with a high IgE level. He has already been advised avoidance techniques. He has PFTs pending  #2 Atypical chest pain Likely musculoskeletal from coughing. He's had a recent normal cardiac evaluation. I will get a chest x-ray today.  Plan/Recommendations: - Continue trelegy, albuterol rescue inhaler. - CXR today -  Follow PFTs  Chilton GreathousePraveen Mannam MD [facility] Pager [phone] 01/19/2016, 12:27 PM

## 2016-01-29 NOTE — Telephone Encounter (Signed)
Yes. It is OK to give a work note saying that we advise him to stay from work till seen again in office on 2/26.  PM

## 2016-01-29 NOTE — Telephone Encounter (Signed)
Called and spoke to pt. Informed him of the letter. This has been placed up front for pick up per pt's request. Pt verbalized understanding and denied any further questions or concerns at this time.

## 2016-02-02 NOTE — Telephone Encounter (Signed)
Pt calling back about disability forms.Spencer [name]

## 2016-02-02 NOTE — Telephone Encounter (Signed)
Called pt and made him aware that this has been sent to Dr Isaiah SergeMannam and we will call him once this is taken care of.   Pt States that a call needs to be made to his Disability Company at the number listed below;  they need current prognosis and return date to work and if any limitations. Please advise Dr Isaiah SergeMannam. Thanks.

## 2016-02-02 NOTE — Telephone Encounter (Signed)
PM please advise.  Thank you!

## 2016-02-03 NOTE — Telephone Encounter (Signed)
PM please advise. Thanks  

## 2016-02-03 NOTE — Telephone Encounter (Signed)
Disability office calling wanting to know if their is a return to work order and of there are any restriction in place [phone] lindsey.Caren GriffinsStanley A Dalton

## 2016-02-03 NOTE — Telephone Encounter (Signed)
Please let him know that I called last week and got an answering service. I will call again today.  PM

## 2016-02-03 NOTE — Telephone Encounter (Signed)
lmtcb X1 

## 2016-02-03 NOTE — Telephone Encounter (Signed)
Patient came to pick up work note - advised that PM tried to call disability company last week with no response and that he will try again today - pt verbalized understanding -pr

## 2016-02-04 ENCOUNTER — Telehealth: Payer: Self-pay

## 2016-02-04 NOTE — Telephone Encounter (Signed)
error 

## 2016-02-04 NOTE — Telephone Encounter (Signed)
I called and spoke with the disability officeI have . I have advised him that he can return to work after the office visit on 02/29/16. The only restriction I place is that he be given a different job than the one he was doing before the clinic visit.  PM

## 2016-02-04 NOTE — Telephone Encounter (Signed)
Spoke with pt. He is aware that PM contacted his employer with his return to work date and his restrictions. Pt was very appreciative. Nothing further was needed at this time.

## 2016-02-29 ENCOUNTER — Ambulatory Visit (INDEPENDENT_AMBULATORY_CARE_PROVIDER_SITE_OTHER): Payer: BLUE CROSS/BLUE SHIELD | Admitting: Pulmonary Disease

## 2016-02-29 ENCOUNTER — Encounter: Payer: Self-pay | Admitting: Pulmonary Disease

## 2016-02-29 VITALS — BP 130/82 | HR 85 | Ht 70.0 in | Wt 264.0 lb

## 2016-02-29 DIAGNOSIS — J45909 Unspecified asthma, uncomplicated: Secondary | ICD-10-CM | POA: Diagnosis not present

## 2016-02-29 DIAGNOSIS — Z23 Encounter for immunization: Secondary | ICD-10-CM | POA: Diagnosis not present

## 2016-02-29 MED ORDER — FLUTICASONE FUROATE-VILANTEROL 200-25 MCG/INH IN AEPB: 1.0000 | INHALATION_SPRAY | Freq: Every day | RESPIRATORY_TRACT | 0 refills | Status: DC

## 2016-02-29 NOTE — Patient Instructions (Signed)
We will continue a work office note reccomending   In my opinion it would be better if you do not return to your previous work due to risk of exacerbating your asthma from chemical exposure.  We will give samples of breo inhalers. Please check with your insurance company about which inhalers are covered  Return in 3 months

## 2016-02-29 NOTE — Progress Notes (Signed)
Spencer Buckley    [id number]    1965/03/01  Primary Care Physician:BECKER, Ann Maki, PA  Referring Physician: Wilfrid Lund, PA [address] [place], [place] [address]  Chief complaint:  Consult for  Atopic asthma with occupational exposure Allergies  HPI: Spencer Buckley is a 51 year old with past medical history of childhood asthma. He has complains of daily symptoms with dyspnea, chest tightness, chest pain, wheezing since October 2016 when he started a new job at a [employer]. 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 He is unable to specify what each one is.  He has symptoms of daily nighttime awakenings. He denies any cough, sputum production, fevers, chills. He has worsening symptoms every time he goes to work. He has been recommended a 4 hr restriction at work and Monday Tuesday and Wednesday schedule. He went to [facility] for a vacation for a week a few months ago and reports that the symptoms were better when he was away from work. He has history of childhood asthma and atopic allergy but the symptoms improved in adulthood. He has been given 2 weeks of Advair and a prednisone taper a couple of months ago. He cannot tell if this made any difference in his symptoms.   He was evaluated at [facility] with a negative CTA and a stress test. He underwent a cardiac cath on 11/24/15 which showed clean coronaries. Also noted to have elevated LFTs and CPK. He has been referred to rheumatology for evaluation of autoimmune disease, rhabdomyolysis.  Interim History: He had a flulike illness 3 weeks ago. He did not get Tamiflu but treated symptomatically with hydrocodone cough syrup. He returns today with mild improvement in his wheezing. He still has some cough with mucus production. He continues on the breo inhaler.   He still has lt sided chest pain aggravated with cough. Of note he had a recent work up with negative CTA  and negative cardiac ischemic workup  Outpatient Encounter Prescriptions as of 02/29/2016  Medication Sig  . albuterol (PROAIR HFA) 108 (90 Base) MCG/ACT inhaler Inhale 2 puffs into the lungs every 6 (six) hours as needed for wheezing or shortness of breath.  Marland Kitchen aspirin EC 81 MG tablet Take 162 mg by mouth daily.  . Cholecalciferol (VITAMIN D3) 2000 units capsule Take 2,000 Units by mouth daily.   . cyclobenzaprine (FLEXERIL) 5 MG tablet Take 1 tablet (5 mg total) by mouth 3 (three) times daily as needed for muscle spasms.  Marland Kitchen escitalopram (LEXAPRO) 10 MG tablet Take 10 mg by mouth daily.  . fluticasone furoate-vilanterol (BREO ELLIPTA) 200-25 MCG/INH AEPB Inhale 1 puff into the lungs daily.  Marland Kitchen gabapentin (NEURONTIN) 300 MG capsule Take 300 mg by mouth 2 (two) times daily as needed (pain).   . meloxicam (MOBIC) 7.5 MG tablet Take 1 tablet (7.5 mg total) by mouth daily.  . metoprolol tartrate (LOPRESSOR) 25 MG tablet Take 25 mg by mouth 2 (two) times daily.  . traMADol (ULTRAM) 50 MG tablet 1-2 TABLET EVERY 8 HR PRN FOR COUGH AND PAIN  . traZODone (DESYREL) 50 MG tablet Take 50 mg by mouth at bedtime as needed for sleep.  . valsartan (DIOVAN) 320 MG tablet Take 320 mg by mouth daily.  . [DISCONTINUED] predniSONE (DELTASONE) 10 MG tablet 60mg  X3 days, 50mg  X3 days, 40mg  X3 days, 30mg  X3 days, 20mg  X3 days, 10mg  X3 days.  No facility-administered encounter medications on file as of 02/29/2016.     Allergies as of 02/29/2016  . (No Known Allergies)    Past Medical History:  Diagnosis Date  . Anxiety   . Chest pain   . Chronic headaches   . Dysphagia   . ED (erectile dysfunction)   . Hypertension   . Myalgia   . SOB (shortness of breath)   . Spinal stenosis   . Vitamin D deficiency     Past Surgical History:  Procedure Laterality Date  . BACK SURGERY Right   . CARDIAC CATHETERIZATION N/A 11/30/2015   Procedure: Left Heart Cath and Coronary Angiography;  Surgeon: Wendall Stade, MD;   Location: [facility] INVASIVE CV LAB;  Service: Cardiovascular;  Laterality: N/A;  . KNEE SURGERY    . SHOULDER SURGERY      Family History  Problem Relation Age of Onset  . Hypertension Mother   . Hypertension Father   . Hyperlipidemia Father     Social History   Social History  . Marital status: Married    Spouse name: N/A  . Number of children: N/A  . Years of education: N/A   Occupational History  . Not on file.   Social History Main Topics  . Smoking status: Never Smoker  . Smokeless tobacco: Never Used  . Alcohol use 0.0 oz/week  . Drug use: No  . Sexual activity: Not on file   Other Topics Concern  . Not on file   Social History Narrative  . No narrative on file   Review of systems: Review of Systems  Constitutional: Negative for fever and chills.  HENT: Negative.   Eyes: Negative for blurred vision.  Respiratory: as per HPI  Cardiovascular: Negative for chest pain and palpitations.  Gastrointestinal: Negative for vomiting, diarrhea, blood per rectum. Genitourinary: Negative for dysuria, urgency, frequency and hematuria.  Musculoskeletal: Negative for myalgias, back pain and joint pain.  Skin: Negative for itching and rash.  Neurological: Negative for dizziness, tremors, focal weakness, seizures and loss of consciousness.  Endo/Heme/Allergies: Negative for environmental allergies.  Psychiatric/Behavioral: Negative for depression, suicidal ideas and hallucinations.  All other systems reviewed and are negative.  Physical Exam: Blood pressure 130/82, pulse 85, height 5\' 10"  (1.778 m), weight 119.7 kg (264 lb), SpO2 97 %. Gen:      No acute distress HEENT:  EOMI, sclera anicteric Neck:     No masses; no thyromegaly Lungs:    Clear to auscultation bilaterally; normal respiratory effort CV:         Regular rate and rhythm; no murmurs Abd:      + bowel sounds; soft, non-tender; no palpable masses, no distension Ext:    No edema; adequate peripheral  perfusion Skin:      Warm and dry; no rash Neuro: alert and oriented x 3 Psych: normal mood and affect  Data Reviewed: Stress treadmill 11/13/15- Negative. CTA 11/12/15- No PE, no abnormal lung findings. Chest x-ray 11/12/15-no acute cardio pulmonary abnormality Abdominal ultrasound 11/13/15-hepatic steatosis. Cardiac cath 11/30/15- Normal coronaries Images reviewed  FENO 12/16/15- 45 FENO 01/19/15- 11  Lab tests 01/06/16 Blood allergy profile  High allergy antibody levels especially for house dust mites, cat and dog. 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 He has history of childhood asthma  and atopic allergy. I have advised him to limit exposure and change jobs if possible. He is currently on disability and does not plan on returning to the same job and wants a work note regarding this.  He will continue on the Breo inhaler and get PFTs ordered.   Plan/Recommendations: - Continue breo, albuterol rescue inhaler.  Chilton GreathousePraveen Tina Temme MD [facility] Pager [phone] 02/29/2016, 11:05 AM  CC: Wilfrid LundBecker, Anna G, GeorgiaPA

## 2016-05-31 ENCOUNTER — Telehealth: Payer: Self-pay

## 2016-05-31 ENCOUNTER — Ambulatory Visit: Payer: BLUE CROSS/BLUE SHIELD | Admitting: Pulmonary Disease

## 2016-05-31 NOTE — Telephone Encounter (Signed)
Spoke with pt, in regards to rescheduling 05/31/16 apt due to schedule error and PM not being in office. Pt states he will call back to reschedule to starting a new job, and having a new insurance. Will await call back. Nothing further needed at this time.

## 2017-08-28 ENCOUNTER — Encounter (HOSPITAL_COMMUNITY): Payer: Self-pay | Admitting: Emergency Medicine

## 2017-08-28 ENCOUNTER — Emergency Department (HOSPITAL_COMMUNITY)
Admission: EM | Admit: 2017-08-28 | Discharge: 2017-08-29 | Disposition: A | Discharge status: Home / Self Care | Payer: Medicaid Other | Attending: Emergency Medicine | Admitting: Emergency Medicine

## 2017-08-28 ENCOUNTER — Other Ambulatory Visit: Payer: Self-pay

## 2017-08-28 DIAGNOSIS — I10 Essential (primary) hypertension: Secondary | ICD-10-CM | POA: Insufficient documentation

## 2017-08-28 DIAGNOSIS — M5416 Radiculopathy, lumbar region: Secondary | ICD-10-CM | POA: Insufficient documentation

## 2017-08-28 DIAGNOSIS — M545 Low back pain: Secondary | ICD-10-CM | POA: Diagnosis present

## 2017-08-28 DIAGNOSIS — Z79899 Other long term (current) drug therapy: Secondary | ICD-10-CM | POA: Diagnosis not present

## 2017-08-28 DIAGNOSIS — Z7982 Long term (current) use of aspirin: Secondary | ICD-10-CM | POA: Diagnosis not present

## 2017-08-28 MED ORDER — OXYCODONE-ACETAMINOPHEN 5-325 MG PO TABS
1.0000 | ORAL_TABLET | Freq: Once | ORAL | Status: AC
  Administered 2017-08-28: 1 via ORAL
  Filled 2017-08-28: qty 1

## 2017-08-28 MED ORDER — LIDOCAINE 5 % EX PTCH
1.0000 | MEDICATED_PATCH | Freq: Once | CUTANEOUS | Status: DC
  Administered 2017-08-28: 1 via TRANSDERMAL
  Filled 2017-08-28: qty 1

## 2017-08-28 MED ORDER — ACETAMINOPHEN 500 MG PO TABS
1000.0000 mg | ORAL_TABLET | Freq: Once | ORAL | Status: AC
  Administered 2017-08-28: 1000 mg via ORAL
  Filled 2017-08-28: qty 2

## 2017-08-28 MED ORDER — LIDOCAINE 5 % EX OINT: 1.0000 "application " | TOPICAL_OINTMENT | Freq: Every day | CUTANEOUS | 0 refills | Status: DC | PRN

## 2017-08-28 MED ORDER — IBUPROFEN 600 MG PO TABS: 600.0000 mg | ORAL_TABLET | Freq: Four times a day (QID) | ORAL | 0 refills | Status: DC | PRN

## 2017-08-28 MED ORDER — ACETAMINOPHEN ER 650 MG PO TBCR: 650.0000 mg | EXTENDED_RELEASE_TABLET | Freq: Three times a day (TID) | ORAL | 0 refills | Status: DC | PRN

## 2017-08-28 MED ORDER — IBUPROFEN 200 MG PO TABS
600.0000 mg | ORAL_TABLET | Freq: Once | ORAL | Status: AC
  Administered 2017-08-28: 600 mg via ORAL
  Filled 2017-08-28: qty 3

## 2017-08-28 NOTE — ED Triage Notes (Signed)
Pt reports mid back pain with radiation to left leg and knee since 08/22/17. Pt denies any injury but has had prior back sx.

## 2017-08-28 NOTE — ED Provider Notes (Signed)
[facility]-EMERGENCY DEPT Provider Note   CSN: [id number] Arrival date & time: 08/28/17  1938     History   Chief Complaint Chief Complaint  Patient presents with  . Back Pain    HPI Spencer Buckley is a 52 y.o. male.  HPI  52 year old male with history of degenerative disc disease of the lumbar spine, status post decompression surgery several years ago comes in with chief complaint of back pain.  Patient reports that he started having the current flareup of his back pain about a week ago.  Pain has been gradually getting more severe and lasting longer.  Pain is located in the lower lumbar region and radiates down his left buttock towards the knee.  Pt has no associated numbness, weakness, urinary incontinence, urinary retention, bowel incontinence, pins and needle sensation in the perineal area.    Past Medical History:  Diagnosis Date  . Anxiety   . Chest pain   . Chronic headaches   . Dysphagia   . ED (erectile dysfunction)   . Hypertension   . Myalgia   . SOB (shortness of breath)   . Spinal stenosis   . Vitamin D deficiency     Patient Active Problem List   Diagnosis Date Noted  . Left-sided chest wall pain 01/06/2016  . Occupational asthma with acute exacerbation 01/06/2016  . Dyspnea 12/16/2015    Past Surgical History:  Procedure Laterality Date  . BACK SURGERY Right   . CARDIAC CATHETERIZATION N/A 11/30/2015   Procedure: Left Heart Cath and Coronary Angiography;  Surgeon: Wendall Stade, MD;  Location: [facility] INVASIVE CV LAB;  Service: Cardiovascular;  Laterality: N/A;  . KNEE SURGERY    . SHOULDER SURGERY          Home Medications    Prior to Admission medications   Medication Sig Start Date End Date Taking? Authorizing Provider  acetaminophen (TYLENOL 8 HOUR) 650 MG CR tablet Take 1 tablet (650 mg total) by mouth every 8 (eight) hours as needed. 08/28/17   Derwood Kaplan, MD  albuterol (PROAIR HFA) 108 (90 Base) MCG/ACT inhaler  Inhale 2 puffs into the lungs every 6 (six) hours as needed for wheezing or shortness of breath. 12/16/15   Nyoka Cowden, MD  aspirin EC 81 MG tablet Take 162 mg by mouth daily.    [provider]  Cholecalciferol (VITAMIN D3) 2000 units capsule Take 2,000 Units by mouth daily.     [provider]  cyclobenzaprine (FLEXERIL) 5 MG tablet Take 1 tablet (5 mg total) by mouth 3 (three) times daily as needed for muscle spasms. 12/29/15   Elvina Sidle, MD  escitalopram (LEXAPRO) 10 MG tablet Take 10 mg by mouth daily. 11/24/15   [provider]  fluticasone furoate-vilanterol (BREO ELLIPTA) 200-25 MCG/INH AEPB Inhale 1 puff into the lungs daily. 12/16/15   Mannam, Colbert Coyer, MD  fluticasone furoate-vilanterol (BREO ELLIPTA) 200-25 MCG/INH AEPB Inhale 1 puff into the lungs daily. 02/29/16   Mannam, Colbert Coyer, MD  gabapentin (NEURONTIN) 300 MG capsule Take 300 mg by mouth 2 (two) times daily as needed (pain).     [provider]  ibuprofen (ADVIL,MOTRIN) 600 MG tablet Take 1 tablet (600 mg total) by mouth every 6 (six) hours as needed. 08/28/17   Derwood Kaplan, MD  lidocaine (XYLOCAINE) 5 % ointment Apply 1 application topically daily as needed. 08/28/17   Derwood Kaplan, MD  metoprolol tartrate (LOPRESSOR) 25 MG tablet Take 25 mg by mouth 2 (two) times  daily.    [provider]  traMADol (ULTRAM) 50 MG tablet 1-2 TABLET EVERY 8 HR PRN FOR COUGH AND PAIN 01/06/16   Jetty Duhamel D, MD  traZODone (DESYREL) 50 MG tablet Take 50 mg by mouth at bedtime as needed for sleep.    [provider]  valsartan (DIOVAN) 320 MG tablet Take 320 mg by mouth daily.    [provider]    Family History Family History  Problem Relation Age of Onset  . Hypertension Mother   . Hypertension Father   . Hyperlipidemia Father     Social History Social History   Tobacco Use  . Smoking status: Never Smoker  . Smokeless tobacco: Never Used  Substance Use  Topics  . Alcohol use: Yes    Alcohol/week: 0.0 standard drinks  . Drug use: No     Allergies   Patient has no known allergies.   Review of Systems Review of Systems  Constitutional: Positive for activity change.  Genitourinary: Negative for difficulty urinating.  Musculoskeletal: Positive for back pain.  Neurological: Negative for weakness and numbness.     Physical Exam Updated Vital Signs BP (!) 161/88 (BP Location: Right Arm)   Pulse 81   Temp 97.9 F (36.6 C) (Oral)   Resp 19   Ht 5' 9.5" (1.765 m)   Wt 111.1 kg   SpO2 94%   BMI 35.66 kg/m   Physical Exam  Constitutional: He is oriented to person, place, and time. He appears well-developed.  HENT:  Head: Atraumatic.  Neck: Neck supple.  Cardiovascular: Normal rate.  Pulmonary/Chest: Effort normal.  Musculoskeletal:  Pt has tenderness over the lumbar region No step offs, no erythema. Able to discriminate between sharp and dull. Able to ambulate   Neurological: He is alert and oriented to person, place, and time.  Skin: Skin is warm.  Nursing note and vitals reviewed.    ED Treatments / Results  Labs (all labs ordered are listed, but only abnormal results are displayed) Labs Reviewed - No data to display  EKG None  Radiology No results found.  Procedures Procedures (including critical care time)  Medications Ordered in ED Medications  lidocaine (LIDODERM) 5 % 1-3 patch (1 patch Transdermal Patch Applied 08/28/17 2354)  oxyCODONE-acetaminophen (PERCOCET/ROXICET) 5-325 MG per tablet 1 tablet (has no administration in time range)  acetaminophen (TYLENOL) tablet 1,000 mg (1,000 mg Oral Given 08/28/17 2353)  ibuprofen (ADVIL,MOTRIN) tablet 600 mg (600 mg Oral Given 08/28/17 2353)     Initial Impression / Assessment and Plan / ED Course  I have reviewed the triage vital signs and the nursing notes.  Pertinent labs & imaging results that were available during my care of the patient were reviewed  by me and considered in my medical decision making (see chart for details).     52 year old male comes in with chief complaint of back pain. Patient has known history of lumbar spine degenerative disc disease.  He is having radicular symptoms based on the history and exam, and I do not have clinical concerns for spinal cord compression or cauda equina syndrome.  We will treat patient's symptoms conservatively.  Back exercises have been provided. Strict ER return precautions have been discussed, and patient is agreeing with the plan and is comfortable with the workup done and the recommendations from the ER.   Final Clinical Impressions(s) / ED Diagnoses   Final diagnoses:  Lumbar radiculopathy    ED Discharge Orders  Ordered    acetaminophen (TYLENOL 8 HOUR) 650 MG CR tablet  Every 8 hours PRN     08/28/17 2357    ibuprofen (ADVIL,MOTRIN) 600 MG tablet  Every 6 hours PRN     08/28/17 2357    lidocaine (XYLOCAINE) 5 % ointment  Daily PRN     08/28/17 2357           Derwood KaplanNanavati, Ereka Brau, MD 08/29/17 0001

## 2017-08-28 NOTE — Discharge Instructions (Signed)
We saw you in the ER for back pain.  We suspect that your pain is because of lumbar radiculopathy.  Please take the ibuprofen every 6 hours for the next 2 days and see your primary care doctor for further pain control.  Please use the back exercises to strengthen the back muscles.

## 2018-01-19 ENCOUNTER — Ambulatory Visit (INDEPENDENT_AMBULATORY_CARE_PROVIDER_SITE_OTHER): Payer: Self-pay

## 2018-01-19 ENCOUNTER — Ambulatory Visit (HOSPITAL_COMMUNITY)
Admission: EM | Admit: 2018-01-19 | Discharge: 2018-01-19 | Disposition: A | Discharge status: Home / Self Care | Payer: Self-pay | Attending: Internal Medicine | Admitting: Internal Medicine

## 2018-01-19 ENCOUNTER — Ambulatory Visit (HOSPITAL_COMMUNITY): Payer: Self-pay

## 2018-01-19 ENCOUNTER — Encounter (HOSPITAL_COMMUNITY): Payer: Self-pay

## 2018-01-19 ENCOUNTER — Other Ambulatory Visit: Payer: Self-pay

## 2018-01-19 DIAGNOSIS — S66912A Strain of unspecified muscle, fascia and tendon at wrist and hand level, left hand, initial encounter: Secondary | ICD-10-CM | POA: Insufficient documentation

## 2018-01-19 DIAGNOSIS — S8012XA Contusion of left lower leg, initial encounter: Secondary | ICD-10-CM

## 2018-01-19 DIAGNOSIS — S80212A Abrasion, left knee, initial encounter: Secondary | ICD-10-CM | POA: Insufficient documentation

## 2018-01-19 DIAGNOSIS — S5012XA Contusion of left forearm, initial encounter: Secondary | ICD-10-CM | POA: Insufficient documentation

## 2018-01-19 DIAGNOSIS — S39012A Strain of muscle, fascia and tendon of lower back, initial encounter: Secondary | ICD-10-CM | POA: Insufficient documentation

## 2018-01-19 DIAGNOSIS — S8002XA Contusion of left knee, initial encounter: Secondary | ICD-10-CM

## 2018-01-19 DIAGNOSIS — S96912A Strain of unspecified muscle and tendon at ankle and foot level, left foot, initial encounter: Secondary | ICD-10-CM | POA: Insufficient documentation

## 2018-01-19 DIAGNOSIS — S161XXA Strain of muscle, fascia and tendon at neck level, initial encounter: Secondary | ICD-10-CM | POA: Insufficient documentation

## 2018-01-19 MED ORDER — IBUPROFEN 800 MG PO TABS: 800.0000 mg | ORAL_TABLET | Freq: Three times a day (TID) | ORAL | 0 refills | Status: DC

## 2018-01-19 MED ORDER — CYCLOBENZAPRINE HCL 5 MG PO TABS: 5.0000 mg | ORAL_TABLET | Freq: Three times a day (TID) | ORAL | 0 refills | Status: DC | PRN

## 2018-01-19 MED ORDER — KETOROLAC TROMETHAMINE 60 MG/2ML IM SOLN
INTRAMUSCULAR | Status: AC
  Filled 2018-01-19: qty 2

## 2018-01-19 MED ORDER — KETOROLAC TROMETHAMINE 60 MG/2ML IM SOLN
60.0000 mg | Freq: Once | INTRAMUSCULAR | Status: AC
  Administered 2018-01-19: 60 mg via INTRAMUSCULAR

## 2018-01-19 NOTE — ED Provider Notes (Signed)
[facility]    CSN: [id number] Arrival date & time: 01/19/18  1934     History   Chief Complaint Chief Complaint  Patient presents with  . Generalized Body Aches    HPI Spencer Buckley is a 53 y.o. male.   Pt was going down steps with his hands full and his L foot hit the edge where there was still sticking up and landed on his L knee. He scraped it and went back to his place and cleaned it with peroxide. Went out to get his kids and as the evening went on started feeling more stiffness on his L knee, L ankle aches and lower back and hips. Has hx of discectomy of his lower lumbar spine. Pain from back radiates to his L mid calf which is more intense than his chronic  pain. He also twisted his L foot and  Has tenderness on the lateral foot area. He only iced his L knee, not the other areas. He is also having pain on his L neck muscle area and L hand between small finger and ring finger and proximal elbow area. He did not hit his head.     Past Medical History:  Diagnosis Date  . Anxiety   . Chest pain   . Chronic headaches   . Dysphagia   . ED (erectile dysfunction)   . Hypertension   . Myalgia   . SOB (shortness of breath)   . Spinal stenosis   . Vitamin D deficiency     Patient Active Problem List   Diagnosis Date Noted  . Left-sided chest wall pain 01/06/2016  . Occupational asthma with acute exacerbation 01/06/2016  . Dyspnea 12/16/2015    Past Surgical History:  Procedure Laterality Date  . BACK SURGERY Right   . CARDIAC CATHETERIZATION N/A 11/30/2015   Procedure: Left Heart Cath and Coronary Angiography;  Surgeon: Wendall Stade, MD;  Location: [facility] INVASIVE CV LAB;  Service: Cardiovascular;  Laterality: N/A;  . KNEE SURGERY    . SHOULDER SURGERY       Home Medications    Prior to Admission medications   Medication Sig Start Date End Date Taking? Authorizing Provider  acetaminophen (TYLENOL 8 HOUR) 650 MG CR tablet Take 1 tablet (650 mg total) by  mouth every 8 (eight) hours as needed. 08/28/17   Derwood Kaplan, MD  albuterol (PROAIR HFA) 108 (90 Base) MCG/ACT inhaler Inhale 2 puffs into the lungs every 6 (six) hours as needed for wheezing or shortness of breath. 12/16/15   Nyoka Cowden, MD  aspirin EC 81 MG tablet Take 162 mg by mouth daily.    [provider]  Cholecalciferol (VITAMIN D3) 2000 units capsule Take 2,000 Units by mouth daily.     [provider]  cyclobenzaprine (FLEXERIL) 5 MG tablet Take 1 tablet (5 mg total) by mouth 3 (three) times daily as needed for muscle spasms. 01/19/18   Rodriguez-Southworth, Nettie Elm, PA-C  escitalopram (LEXAPRO) 10 MG tablet Take 10 mg by mouth daily. 11/24/15   [provider]  fluticasone furoate-vilanterol (BREO ELLIPTA) 200-25 MCG/INH AEPB Inhale 1 puff into the lungs daily. 12/16/15   Mannam, Colbert Coyer, MD  fluticasone furoate-vilanterol (BREO ELLIPTA) 200-25 MCG/INH AEPB Inhale 1 puff into the lungs daily. 02/29/16   Mannam, Colbert Coyer, MD  gabapentin (NEURONTIN) 300 MG capsule Take 300 mg by mouth 2 (two) times daily as needed (pain).     [provider]  ibuprofen (ADVIL,MOTRIN) 600 MG tablet Take 1  tablet (600 mg total) by mouth every 6 (six) hours as needed. 08/28/17   Derwood KaplanNanavati, Ankit, MD  ibuprofen (ADVIL,MOTRIN) 800 MG tablet Take 1 tablet (800 mg total) by mouth 3 (three) times daily. 01/19/18   Rodriguez-Southworth, Nettie ElmSylvia, PA-C  lidocaine (XYLOCAINE) 5 % ointment Apply 1 application topically daily as needed. 08/28/17   Derwood KaplanNanavati, Ankit, MD  metoprolol tartrate (LOPRESSOR) 25 MG tablet Take 25 mg by mouth 2 (two) times daily.    [provider]  traMADol (ULTRAM) 50 MG tablet 1-2 TABLET EVERY 8 HR PRN FOR COUGH AND PAIN 01/06/16   Jetty DuhamelYoung, Clinton D, MD  traZODone (DESYREL) 50 MG tablet Take 50 mg by mouth at bedtime as needed for sleep.    [provider]  valsartan (DIOVAN) 320 MG tablet Take 320 mg by mouth daily.    [provider]      Family History Family History  Problem Relation Age of Onset  . Hypertension Mother   . Hypertension Father   . Hyperlipidemia Father     Social History Social History   Tobacco Use  . Smoking status: Never Smoker  . Smokeless tobacco: Never Used  Substance Use Topics  . Alcohol use: Yes    Alcohol/week: 0.0 standard drinks  . Drug use: No     Allergies   Other   Review of Systems Review of Systems  Gastrointestinal:       No bowel incontinence  Genitourinary: Negative for enuresis.  Musculoskeletal: Positive for arthralgias, back pain, gait problem, neck pain and neck stiffness. Negative for joint swelling.  Skin: Positive for wound. Negative for color change and rash.  Neurological: Positive for numbness. Negative for weakness.       Has chronic L leg paresthesia from past disc surgery     Physical Exam Triage Vital Signs ED Triage Vitals  Enc Vitals Group     BP 01/19/18 1959 (!) 168/87     Pulse Rate 01/19/18 1959 77     Resp 01/19/18 1959 17     Temp 01/19/18 1959 98.7 F (37.1 C)     Temp Source 01/19/18 1959 Oral     SpO2 01/19/18 1959 100 %     Weight --      Height --      Head Circumference --      Peak Flow --      Pain Score 01/19/18 2000 8     Pain Loc --      Pain Edu? --      Excl. in GC? --    No data found.  Updated Vital Signs BP (!) 168/87 (BP Location: Left Arm)   Pulse 77   Temp 98.7 F (37.1 C) (Oral)   Resp 17   SpO2 100%   Visual Acuity Right Eye Distance:   Left Eye Distance:   Bilateral Distance:    Right Eye Near:   Left Eye Near:    Bilateral Near:     Physical Exam Vitals signs and nursing note reviewed.  Constitutional:      General: He is not in acute distress.    Appearance: Normal appearance. He is obese. He is not toxic-appearing.     Comments: Walks slow and limps a little when he walks and seems in pain when he moves  HENT:     Head: Normocephalic.     Nose: Nose normal.  Eyes:     General:  No scleral icterus.    Extraocular Movements: Extraocular  movements intact.     Conjunctiva/sclera: Conjunctivae normal.     Pupils: Pupils are equal, round, and reactive to light.  Neck:     Musculoskeletal: Neck supple. Muscular tenderness present. No neck rigidity.     Comments: Has local tenderness on L upper and mid  trapezius muscle.   Pulmonary:     Effort: Pulmonary effort is normal.  Musculoskeletal:        General: No swelling or tenderness.     Right lower leg: No edema.     Left lower leg: No edema.     Comments: BACK- has no vertebral thoracic or lumbar tenderness. Has tenderness of both lumbosacral soft tissue area and L buttocks region and and tightness and pain noted of L piriformis. Able to flex anteriorly to 90 degrees, but rasing up provoked lower lumbar pain. He could not posteriorly flex due to pain. Neg SLR.  HIPS- normal ROM, both leg lengths are symmetric. ROM of hips provoked lower back pain and worse with L side.  KNEE- L knee with no swelling or effusion. Seems to have exaggerated pain over the entire knee. He is unable to fully extend or flex to 90 degrees when I was trying, but when he was sitting while we conversed, his knee was flexed to 90 degrees. No laxity noted on medial and lateral collateral region, and ACL was not tested due to his pain and abrasion.  L UE- no deformity or abrasion or ecchymosis noted on his L elbow, forearm and hand. Has mild tenderness on volar proximal elbow and soft tissue between small finger and ring finger.   Skin:    General: Skin is warm and dry.     Findings: Bruising present. No erythema or rash.     Comments: Has faint bruisng of L knee surrounding his abrasion  Neurological:     Mental Status: He is alert and oriented to person, place, and time.     Motor: Weakness present.     Gait: Gait abnormal.     Comments: L upper and lower extremity strength 4/4, R side is 5/5  Psychiatric:        Mood and Affect: Mood normal.         Behavior: Behavior normal.        Thought Content: Thought content normal.        Judgment: Judgment normal.    UC Treatments / Results  Labs (all labs ordered are listed, but only abnormal results are displayed) Labs Reviewed - No data to display  EKG None  Radiology Dg Foot 2 Views Left  Result Date: 01/19/2018 CLINICAL DATA:  Status post fall with left foot pain. EXAM: LEFT FOOT - 2 VIEW COMPARISON:  None. FINDINGS: There is no evidence of fracture or dislocation. There is no evidence of arthropathy or other focal bone abnormality. Soft tissues are unremarkable. IMPRESSION: Negative. Electronically Signed   By: Sherian ReinWei-Chen  Lin M.D.   On: 01/19/2018 20:52    Procedures Procedures   Medications Ordered in UC Medications  ketorolac (TORADOL) injection 60 mg (has no administration in time range)    Initial Impression / Assessment and Plan / UC Course  I have reviewed the triage vital signs and the nursing notes. Pertinent  imaging results that were available during my care of the patient were reviewed by me and considered in my medical decision making (see chart for details). He was placed on a post op shoe which helped his L foot pain. Told  to keep it on til he sees PCP and may remove it for showers.  He was given Toradol 60 mg IM here, and sent rx for Ibuprofen 800 mg tid and Flexeril 10 mg tid which he has tolerated in the past. See instructions.  Fu with PCP  Next week.   Final Clinical Impressions(s) / UC Diagnoses   Final diagnoses:  Strain of neck muscle, initial encounter  Lumbar strain, initial encounter  Contusion of knee and lower leg, left, initial encounter  Muscle strain of left foot, initial encounter  Abrasion, knee, left, initial encounter  Hand strain, left, initial encounter  Contusion of left forearm, initial encounter     Discharge Instructions     Your foot xray does not show a broken bone, but stress fractures may not show for a couple of weeks.  For now I'd like you to wear a hard non flexible sole shoe.   Ice areas of pain for 15 minutes 2-4 times a day for 48h, then you can alternate with heat.  Your will be more sore tomorrow than right now, so I will have your try some muscle relaxer's to help the tight muscles and stiffness.   Follow up with your family Dr next week for re-check and possibly try PT to help you heal faster.   Clean the knee scrape with soap and water and apply antibiotic ointment twice a day for 7 days. Watch for signs of infection like redness, swelling and hotness.     ED Prescriptions    Medication Sig Dispense Auth. Provider   ibuprofen (ADVIL,MOTRIN) 800 MG tablet Take 1 tablet (800 mg total) by mouth 3 (three) times daily. 30 tablet Rodriguez-Southworth, Nettie Elm, PA-C   cyclobenzaprine (FLEXERIL) 5 MG tablet  (Status: Discontinued) Take 1 tablet (5 mg total) by mouth 3 (three) times daily as needed for muscle spasms. 21 tablet Rodriguez-Southworth, Nettie Elm, PA-C   cyclobenzaprine (FLEXERIL) 5 MG tablet Take 1 tablet (5 mg total) by mouth 3 (three) times daily as needed for muscle spasms. 21 tablet Rodriguez-Southworth, Nettie Elm, PA-C     Controlled Substance Prescriptions [place] Controlled Substance Registry consulted?    Garey Ham, Cordelia Poche 01/19/18 2118

## 2018-01-19 NOTE — ED Triage Notes (Signed)
Pt presents to [facility] for generalized body pain, pt states he fell in apartment building today and has pain left knee and hip. Pt has applied ice packs for pain.

## 2018-01-19 NOTE — Discharge Instructions (Addendum)
Your foot xray does not show a broken bone, but stress fractures may not show for a couple of weeks. For now I'd like you to wear a hard non flexible sole shoe.   Ice areas of pain for 15 minutes 2-4 times a day for 48h, then you can alternate with heat.  Your will be more sore tomorrow than right now, so I will have your try some muscle relaxer's to help the tight muscles and stiffness.   Follow up with your family Dr next week for re-check and possibly try PT to help you heal faster.   Clean the knee scrape with soap and water and apply antibiotic ointment twice a day for 7 days. Watch for signs of infection like redness, swelling and hotness.

## 2018-03-08 ENCOUNTER — Ambulatory Visit (HOSPITAL_COMMUNITY)
Admission: EM | Admit: 2018-03-08 | Discharge: 2018-03-08 | Disposition: A | Discharge status: Home / Self Care | Payer: Self-pay | Attending: Internal Medicine | Admitting: Internal Medicine

## 2018-03-08 ENCOUNTER — Other Ambulatory Visit: Payer: Self-pay

## 2018-03-08 ENCOUNTER — Encounter (HOSPITAL_COMMUNITY): Payer: Self-pay | Admitting: Emergency Medicine

## 2018-03-08 DIAGNOSIS — A084 Viral intestinal infection, unspecified: Secondary | ICD-10-CM

## 2018-03-08 MED ORDER — IBUPROFEN 800 MG PO TABS: 800.0000 mg | ORAL_TABLET | Freq: Three times a day (TID) | ORAL | 0 refills | Status: DC

## 2018-03-08 NOTE — ED Triage Notes (Signed)
PT reports body aches, fever, vomiting, chills that started Monday. PT reports fever broke today.

## 2018-03-09 NOTE — ED Provider Notes (Signed)
[facility]    CSN: [id number] Arrival date & time: 03/08/18  1410     History   Chief Complaint Chief Complaint  Patient presents with  . Influenza    HPI Dionicia AblerWilton Fester is a 53 y.o. male a history of hypertension comes to the urgent care with complaints of generalized body aches, fever cough and chills that started 4 days prior to being seen in the urgent care.  Symptoms started insidiously and is gotten progressively worse.  Over-the-counter treatment has brought some partial relief but he continues to have some generalized body aches and cough.  Cough is nonproductive.  He denies any nausea or vomiting at this time.  No diarrhea.  No chest pain or chest pressure.  HPI  Past Medical History:  Diagnosis Date  . Anxiety   . Chest pain   . Chronic headaches   . Dysphagia   . ED (erectile dysfunction)   . Hypertension   . Myalgia   . SOB (shortness of breath)   . Spinal stenosis   . Vitamin D deficiency     Patient Active Problem List   Diagnosis Date Noted  . Left-sided chest wall pain 01/06/2016  . Occupational asthma with acute exacerbation 01/06/2016  . Dyspnea 12/16/2015    Past Surgical History:  Procedure Laterality Date  . BACK SURGERY Right   . CARDIAC CATHETERIZATION N/A 11/30/2015   Procedure: Left Heart Cath and Coronary Angiography;  Surgeon: Wendall StadePeter C Nishan, MD;  [facility] LAB;  Service: Cardiovascular;  Laterality: N/A;  . KNEE SURGERY    . SHOULDER SURGERY         Home Medications    Prior to Admission medications   Medication Sig Start Date End Date Taking? Authorizing Provider  acetaminophen (TYLENOL 8 HOUR) 650 MG CR tablet Take 1 tablet (650 mg total) by mouth every 8 (eight) hours as needed. 08/28/17  Yes Derwood KaplanNanavati, Ankit, MD  aspirin EC 81 MG tablet Take 162 mg by mouth daily.   Yes [provider]  cyclobenzaprine (FLEXERIL) 5 MG tablet Take 1 tablet (5 mg total) by mouth 3 (three) times daily as needed  for muscle spasms. 01/19/18  Yes Rodriguez-Southworth, Nettie ElmSylvia, PA-C  fluticasone furoate-vilanterol (BREO ELLIPTA) 200-25 MCG/INH AEPB Inhale 1 puff into the lungs daily. 02/29/16  Yes Mannam, Praveen, MD  albuterol (PROAIR HFA) 108 (90 Base) MCG/ACT inhaler Inhale 2 puffs into the lungs every 6 (six) hours as needed for wheezing or shortness of breath. 12/16/15   Nyoka CowdenWert, Michael B, MD  Cholecalciferol (VITAMIN D3) 2000 units capsule Take 2,000 Units by mouth daily.     [provider]  escitalopram (LEXAPRO) 10 MG tablet Take 10 mg by mouth daily. 11/24/15   [provider]  gabapentin (NEURONTIN) 300 MG capsule Take 300 mg by mouth 2 (two) times daily as needed (pain).     [provider]  ibuprofen (ADVIL,MOTRIN) 800 MG tablet Take 1 tablet (800 mg total) by mouth 3 (three) times daily. 03/08/18   Lamptey, Britta MccreedyPhilip O, MD  lidocaine (XYLOCAINE) 5 % ointment Apply 1 application topically daily as needed. 08/28/17   Derwood KaplanNanavati, Ankit, MD  metoprolol tartrate (LOPRESSOR) 25 MG tablet Take 25 mg by mouth 2 (two) times daily.    [provider]  traMADol (ULTRAM) 50 MG tablet 1-2 TABLET EVERY 8 HR PRN FOR COUGH AND PAIN 01/06/16   Jetty DuhamelYoung, Clinton D, MD  traZODone (DESYREL) 50 MG tablet Take 50 mg by mouth at  bedtime as needed for sleep.    [provider]  valsartan (DIOVAN) 320 MG tablet Take 320 mg by mouth daily.    [provider]    Family History Family History  Problem Relation Age of Onset  . Hypertension Mother   . Hypertension Father   . Hyperlipidemia Father     Social History Social History   Tobacco Use  . Smoking status: Never Smoker  . Smokeless tobacco: Never Used  Substance Use Topics  . Alcohol use: Yes    Alcohol/week: 0.0 standard drinks  . Drug use: No     Allergies   Other   Review of Systems Review of Systems  Constitutional: Positive for activity change, chills, fatigue and fever. Negative for appetite change.    HENT: Positive for congestion. Negative for ear discharge, ear pain and postnasal drip.   Respiratory: Positive for cough. Negative for chest tightness and stridor.   Cardiovascular: Negative for chest pain.  Gastrointestinal: Negative for abdominal distention and abdominal pain.  Neurological: Negative for dizziness, weakness and light-headedness.     Physical Exam Triage Vital Signs ED Triage Vitals [03/08/18 1440]  Enc Vitals Group     BP (!) 158/101     Pulse Rate 80     Resp 16     Temp 98.6 F (37 C)     Temp Source Oral     SpO2 99 %     Weight      Height      Head Circumference      Peak Flow      Pain Score 9     Pain Loc      Pain Edu?      Excl. in GC?    No data found.  Updated Vital Signs BP (!) 158/101   Pulse 80   Temp 98.6 F (37 C) (Oral)   Resp 16   SpO2 99%   Visual Acuity Right Eye Distance:   Left Eye Distance:   Bilateral Distance:    Right Eye Near:   Left Eye Near:    Bilateral Near:     Physical Exam Constitutional:      Appearance: He is ill-appearing.  HENT:     Right Ear: Tympanic membrane normal.     Left Ear: Tympanic membrane normal.     Nose: Nose normal. No congestion.  Neck:     Musculoskeletal: Normal range of motion. No neck rigidity.  Cardiovascular:     Rate and Rhythm: Normal rate and regular rhythm.     Pulses: Normal pulses.     Heart sounds: No murmur. No gallop.   Pulmonary:     Effort: Pulmonary effort is normal.     Breath sounds: No wheezing, rhonchi or rales.  Lymphadenopathy:     Cervical: No cervical adenopathy.  Neurological:     Mental Status: He is alert.      UC Treatments / Results  Labs (all labs ordered are listed, but only abnormal results are displayed) Labs Reviewed - No data to display  EKG None  Radiology No results found.  Procedures Procedures (including critical care time)  Medications Ordered in UC Medications - No data to display  Initial Impression / Assessment  and Plan / UC Course  I have reviewed the triage vital signs and the nursing notes.  Pertinent labs & imaging results that were available during my care of the patient were reviewed by me and considered in my medical decision  making (see chart for details).     1.  Acute viral syndrome; Symptomatic management Tylenol/Motrin for fever/pain  Final Clinical Impressions(s) / UC Diagnoses   Final diagnoses:  Viral gastroenteritis   Discharge Instructions   None    ED Prescriptions    Medication Sig Dispense Auth. Provider   ibuprofen (ADVIL,MOTRIN) 800 MG tablet Take 1 tablet (800 mg total) by mouth 3 (three) times daily. 30 tablet Lamptey, Britta Mccreedy, MD     Controlled Substance Prescriptions [place] Controlled Substance Registry consulted? No   Merrilee Jansky, MD 03/12/18 [phone]

## 2018-06-22 ENCOUNTER — Ambulatory Visit: Payer: Self-pay | Admitting: Orthopedic Surgery

## 2018-06-27 NOTE — Progress Notes (Signed)
[id number] [place] - [address] [facility] [address] [facility] [address] [place] [id number] Phone: [phone] Fax: [phone]      Your procedure is scheduled on July 1  [facility]" at 0630 A.M., and check in at the Admitting office.  Call this number if you have problems the morning of surgery:  [phone]  Call [phone] if you have any questions prior to your surgery date Monday-Friday 8am-4pm    Remember:  Do not eat or drink after midnight.    Take these medicines the morning of surgery with A SIP OF WATER  albuterol (PROAIR HFA)  If needed..  Please bring all inhalers with you the day of surgery.  atorvastatin (LIPITOR) baclofen (LIORESAL)  meclizine (ANTIVERT)  xycodone  If needed for pain  Follow your surgeon's instructions on when to stop Aspirin.  If no instructions were given by your surgeon then you will need to call the office to get those instructions.    7 days prior to surgery STOP taking any  Aleve, Naproxen, Ibuprofen, Motrin, Advil, Goody's, BC's, all herbal medications, fish oil, and all vitamins.    The Morning of Surgery  Do not wear jewelry, make-up or nail polish.  Do not wear lotions, powders, or perfumes/colognes, or deodorant  Do not shave 48 hours prior to surgery.  Men may shave face and neck.  Do not bring valuables to the hospital.  [facility] is not responsible for any belongings or valuables.  If you are a smoker, DO NOT Smoke 24 hours prior to surgery IF you wear a CPAP at night please bring your mask, tubing, and machine the morning of surgery   Remember that you must have someone to transport you home after your surgery, and remain with you for 24 hours if you are discharged the same day.   Contacts, glasses, hearing aids, dentures or bridgework may not be worn into surgery.    Leave your suitcase in the car.  After surgery it may be brought to  your room.  For patients admitted to the hospital, discharge time will be determined by your treatment team.  Patients discharged the day of surgery will not be allowed to drive home.    Special instructions:   [place]- Preparing For Surgery  Before surgery, you can play an important role. Because skin is not sterile, your skin needs to be as free of germs as possible. You can reduce the number of germs on your skin by washing with CHG (chlorahexidine gluconate) Soap before surgery.  CHG is an antiseptic cleaner which kills germs and bonds with the skin to continue killing germs even after washing.    Oral Hygiene is also important to reduce your risk of infection.  Remember - BRUSH YOUR TEETH THE MORNING OF SURGERY WITH YOUR REGULAR TOOTHPASTE  Please do not use if you have an allergy to CHG or antibacterial soaps. If your skin becomes reddened/irritated stop using the CHG.  Do not shave (including legs and underarms) for at least 48 hours prior to first CHG shower. It is OK to shave your face.  Please follow these instructions carefully.   1. Shower the NIGHT BEFORE SURGERY and the MORNING OF SURGERY with CHG Soap.   2. If you chose to wash your hair, wash your hair first as usual with your normal shampoo.  3. After you shampoo, rinse your hair and body  thoroughly to remove the shampoo.  4. Use CHG as you would any other liquid soap. You can apply CHG directly to the skin and wash gently with a scrungie or a clean washcloth.   5. Apply the CHG Soap to your body ONLY FROM THE NECK DOWN.  Do not use on open wounds or open sores. Avoid contact with your eyes, ears, mouth and genitals (private parts). Wash Face and genitals (private parts)  with your normal soap.   6. Wash thoroughly, paying special attention to the area where your surgery will be performed.  7. Thoroughly rinse your body with warm water from the neck down.  8. DO NOT shower/wash with your normal soap after using  and rinsing off the CHG Soap.  9. Pat yourself dry with a CLEAN TOWEL.  10. Wear CLEAN PAJAMAS to bed the night before surgery, wear comfortable clothes the morning of surgery  11. Place CLEAN SHEETS on your bed the night of your first shower and DO NOT SLEEP WITH PETS.    Day of Surgery:  Do not apply any deodorants/lotions.  Please wear clean clothes to the hospital/surgery center.   Remember to brush your teeth WITH YOUR REGULAR TOOTHPASTE.   Please read over the following fact sheets that you were given.

## 2018-06-28 ENCOUNTER — Other Ambulatory Visit: Payer: Self-pay

## 2018-06-28 ENCOUNTER — Encounter (HOSPITAL_COMMUNITY)
Admission: RE | Admit: 2018-06-28 | Discharge: 2018-06-28 | Disposition: A | Discharge status: Home / Self Care | Payer: Medicare HMO | Source: Ambulatory Visit | Attending: Orthopedic Surgery | Admitting: Orthopedic Surgery

## 2018-06-28 ENCOUNTER — Ambulatory Visit (HOSPITAL_COMMUNITY)
Admission: RE | Admit: 2018-06-28 | Discharge: 2018-06-28 | Disposition: A | Discharge status: Home / Self Care | Payer: Medicare HMO | Source: Ambulatory Visit | Attending: Orthopedic Surgery | Admitting: Orthopedic Surgery

## 2018-06-28 ENCOUNTER — Encounter (HOSPITAL_COMMUNITY): Payer: Self-pay

## 2018-06-28 DIAGNOSIS — Z791 Long term (current) use of non-steroidal anti-inflammatories (NSAID): Secondary | ICD-10-CM | POA: Diagnosis not present

## 2018-06-28 DIAGNOSIS — R0789 Other chest pain: Secondary | ICD-10-CM | POA: Diagnosis not present

## 2018-06-28 DIAGNOSIS — E559 Vitamin D deficiency, unspecified: Secondary | ICD-10-CM | POA: Diagnosis not present

## 2018-06-28 DIAGNOSIS — M4727 Other spondylosis with radiculopathy, lumbosacral region: Secondary | ICD-10-CM | POA: Insufficient documentation

## 2018-06-28 DIAGNOSIS — Z1159 Encounter for screening for other viral diseases: Secondary | ICD-10-CM | POA: Insufficient documentation

## 2018-06-28 DIAGNOSIS — Z01818 Encounter for other preprocedural examination: Secondary | ICD-10-CM | POA: Insufficient documentation

## 2018-06-28 DIAGNOSIS — I10 Essential (primary) hypertension: Secondary | ICD-10-CM | POA: Diagnosis not present

## 2018-06-28 DIAGNOSIS — Z7982 Long term (current) use of aspirin: Secondary | ICD-10-CM | POA: Diagnosis not present

## 2018-06-28 DIAGNOSIS — Z79899 Other long term (current) drug therapy: Secondary | ICD-10-CM | POA: Insufficient documentation

## 2018-06-28 DIAGNOSIS — M4807 Spinal stenosis, lumbosacral region: Secondary | ICD-10-CM | POA: Diagnosis not present

## 2018-06-28 HISTORY — DX: Other allergic rhinitis: J30.89

## 2018-06-28 HISTORY — DX: Inflammatory liver disease, unspecified: K75.9

## 2018-06-28 HISTORY — DX: Unspecified osteoarthritis, unspecified site: M19.90

## 2018-06-28 HISTORY — DX: Presence of spectacles and contact lenses: Z97.3

## 2018-06-28 HISTORY — DX: Pure hypercholesterolemia, unspecified: E78.00

## 2018-06-28 HISTORY — DX: Dizziness and giddiness: R42

## 2018-06-28 LAB — BASIC METABOLIC PANEL
Anion gap: 10 (ref 5–15)
BUN: 10 mg/dL (ref 6–20)
CO2: 24 mmol/L (ref 22–32)
Calcium: 8.7 mg/dL — ABNORMAL LOW (ref 8.9–10.3)
Chloride: 102 mmol/L (ref 98–111)
Creatinine, Ser: 0.98 mg/dL (ref 0.61–1.24)
GFR calc Af Amer: >60 mL/min (ref >60)
GFR calc non Af Amer: >60 mL/min (ref >60)
Glucose, Bld: 97 mg/dL (ref 70–99)
Potassium: 3.8 mmol/L (ref 3.5–5.1)
Sodium: 136 mmol/L (ref 135–145)

## 2018-06-28 LAB — URINALYSIS, ROUTINE W REFLEX MICROSCOPIC
Bilirubin Urine: NEGATIVE
Glucose, UA: NEGATIVE mg/dL
Hgb urine dipstick: NEGATIVE
Ketones, ur: NEGATIVE mg/dL
Leukocytes,Ua: NEGATIVE
Nitrite: NEGATIVE
Protein, ur: NEGATIVE mg/dL
Specific Gravity, Urine: 1.025 (ref 1.005–1.030)
pH: 5 (ref 5.0–8.0)

## 2018-06-28 LAB — PROTIME-INR
INR: 1 (ref 0.8–1.2)
Prothrombin Time: 13.3 seconds (ref 11.4–15.2)

## 2018-06-28 LAB — HEPATIC FUNCTION PANEL
ALT: 51 U/L — ABNORMAL HIGH (ref 0–44)
AST: 41 U/L (ref 15–41)
Albumin: 3.7 g/dL (ref 3.5–5.0)
Alkaline Phosphatase: 64 U/L (ref 38–126)
Bilirubin, Direct: 0.2 mg/dL (ref 0.0–0.2)
Indirect Bilirubin: 1.2 mg/dL — ABNORMAL HIGH (ref 0.3–0.9)
Total Bilirubin: 1.4 mg/dL — ABNORMAL HIGH (ref 0.3–1.2)
Total Protein: 6.6 g/dL (ref 6.5–8.1)

## 2018-06-28 LAB — NO BLOOD PRODUCTS

## 2018-06-28 LAB — CBC
HCT: 45.4 % (ref 39.0–52.0)
Hemoglobin: 15.2 g/dL (ref 13.0–17.0)
MCH: 28.4 pg (ref 26.0–34.0)
MCHC: 33.5 g/dL (ref 30.0–36.0)
MCV: 84.9 fL (ref 80.0–100.0)
Platelets: 321 10*3/uL (ref 150–400)
RBC: 5.35 MIL/uL (ref 4.22–5.81)
RDW: 13.2 % (ref 11.5–15.5)
WBC: 6 10*3/uL (ref 4.0–10.5)
nRBC: 0 % (ref 0.0–0.2)

## 2018-06-28 LAB — SURGICAL PCR SCREEN
MRSA, PCR: NEGATIVE
Staphylococcus aureus: NEGATIVE

## 2018-06-28 LAB — APTT: aPTT: 35 seconds (ref 24–36)

## 2018-06-28 NOTE — Progress Notes (Addendum)
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   History includes never smoker, HTN, dyspnea, dysphagia, anxiety, myalgia, headaches, chest pain (with negative work-up 2017--normal coronaries 11/30/15). BMI is consistent with obesity.   Patient evaluated at his 06/28/18 PAT visit. He reports that in 2017 he was experiencing SOB, dysphagia and chest tightness and had a negtive cardiac work-up. He was referred to rheumatologist Pollyann Savoyeveshwar, Shaili, MD for elevated LFTs and rhabdomyolysis. I don't have rheumatology notes, but SOB and chest tightness were ultimately attributed to chemical inhalation at work (asthma with occupational exposure). (He was seen by cardiologist Charlton HawsNishan, Peter, MD and pulmonologist Chilton GreathouseMannam, Praveen, MD and Jetty DuhamelYoung, Clinton, MD for evaluations then [facility] These symptoms have since resolved. He stayed active until a few months ago. He had a previous work related injury many years ago requiring back surgery, but more recently he tripped while walking on an uneven walkway and has had recurrent back issues--even hurting to push down the gas pedal when he drives.   Presurgical COVID test is scheduled for 06/30/18. If negative, and otherwise no acute changes then I would anticipate that he can proceed as planned.   VS: BP (!) 151/92   Pulse 71   Temp 36.9 C   Resp 20   Ht 5' 9.5" (1.765 m)   Wt 118.1 kg   SpO2 98%   BMI 37.89 kg/m  Heart RRR, no murmur noted. Lungs clear.    PROVIDERS: Marva PandaMillsaps, Kimberly, NP is PCP [facility], see Care Everywhere). He is no longer seeing Wilfrid LundBecker, Anna G, PA as his PCP which is who  [facility]   LABS: Labs reviewed: Acceptable for surgery. HFP added given history of elevated LFTs. ALT 51, AST 41 which are essentially unchanged since 11/24/15 labs. His bilirubin is mildly elevated. No reported abdominal pain or N/V. PLT count, PT/PTT WNL. TRANSFUSE NO BLOOD.  (all labs ordered are listed, but only abnormal results are displayed)  Labs Reviewed  BASIC METABOLIC PANEL - Abnormal; Notable for the following components:      Result Value   Calcium 8.7 (*)    All other components within normal limits  HEPATIC FUNCTION PANEL - Abnormal; Notable for the following components:   ALT 51 (*)    Total Bilirubin 1.4 (*)    Indirect Bilirubin 1.2 (*)    All other components within normal limits  SURGICAL PCR SCREEN  APTT  CBC  PROTIME-INR  URINALYSIS, ROUTINE W REFLEX MICROSCOPIC    IMAGES: CXR 06/28/18: FINDINGS: There is no edema or consolidation. The heart size and pulmonary vascularity are normal. No adenopathy. No bone lesions. IMPRESSION: No edema or consolidation.   EKG: 06/28/18:  Sinus bradycardia ST & T wave abnormality, consider lateral ischemia Abnormal ECG Since last tracing rate slower Confirmed by Nanetta BattyBerry, Jonathan [phone]([id number]) on 06/28/2018 11:15:37 AM - ST/T wave abnormality is more pronounced when compared to 11/30/15, but is less pronounced when compared to his 11/24/15 tracing. [facility].   CV: Cardiac cath 11/30/15: Conclusion: Normal Right dominant coronary arteries  Normal LV  EF 65% Non cardiac chest Pain   Carotid US 11/16/15 [facility]): Summary  1-39% stenosis bilaterally in the internal carotid arteries. No significant  plaque noted.  Exercise Stress Echo 11/13/15 [facility]): Summary Functional capacity is normal for age/sex - 10.7 mets on the 2-min Bruce protocol Normal resting biventricular function (ejection fraction), with no resting segmental abnormality. No clinical or echocardiographic  ischemia (induced wall motion abnormality): Negative stress echocardiogram   Past Medical History:  Diagnosis Date  . Anxiety   . Chest pain   . Chronic headaches   . Dysphagia   . ED (erectile dysfunction)   . Hypertension   . Myalgia   . SOB (shortness of breath)   . Spinal stenosis   . Vitamin D deficiency     Past Surgical History:  Procedure Laterality Date  . BACK SURGERY Right   . CARDIAC CATHETERIZATION N/A 11/30/2015   Procedure: Left Heart Cath and Coronary Angiography;  Surgeon: Josue Hector, MD;  Location: [place] CV LAB;  Service: Cardiovascular;  Laterality: N/A;  . KNEE SURGERY    . SHOULDER SURGERY      MEDICATIONS: . albuterol (PROAIR HFA) 108 (90 Base) MCG/ACT inhaler  . aspirin EC 81 MG tablet  . atorvastatin (LIPITOR) 20 MG tablet  . baclofen (LIORESAL) 10 MG tablet  . meclizine (ANTIVERT) 25 MG tablet  . naproxen (NAPROSYN) 500 MG tablet  . Oxycodone HCl 10 MG TABS  . valsartan-hydrochlorothiazide (DIOVAN-HCT) 320-25 MG tablet  . Vitamin D, Ergocalciferol, (DRISDOL) 1.25 MG (50000 UT) CAPS capsule   No current facility-administered medications for this encounter.   He reports that he is no longer taking ASA and is not requiring albuterol inhaler.   Myra Gianotti, PA-C Surgical Short Stay/Anesthesiology [facility] Phone [phone] [facility] Phone [phone] 06/29/2018 10:03 AM

## 2018-06-28 NOTE — Progress Notes (Signed)
   06/28/18 0926  OBSTRUCTIVE SLEEP APNEA  Have you ever been diagnosed with sleep apnea through a sleep study? No  Do you snore loudly (loud enough to be heard through closed doors)?  0  Do you often feel tired, fatigued, or sleepy during the daytime (such as falling asleep during driving or talking to someone)? 0  Has anyone observed you stop breathing during your sleep? 0  Do you have, or are you being treated for high blood pressure? 1  BMI more than 35 kg/m2? 1  Age > 50 (1-yes) 1  Neck circumference greater than:Male 16 inches or larger, Male 17inches or larger? 1  Male Gender (Yes=1) 1  Obstructive Sleep Apnea Score 5

## 2018-06-28 NOTE — Progress Notes (Signed)
[facility] [place], [address] AT [address] [place] [address] Phone: [phone] Fax: [phone]      Your procedure is scheduled on Wednesday, July 04, 2018  Report to [facility] Main Entrance "A" at [place] and check in at the Admitting office.  Call this number if you have problems the morning of surgery:  [phone]  Call [phone] if you have any questions prior to your surgery date Monday-Friday 8am-4pm    Remember:  Do not eat or drink after midnight Tuesday, July 03, 2018    Take these medicines the morning of surgery with A SIP OF WATER: atorvastatin (LIPITOR), baclofen (LIORESAL) , Oxycodone  If needed: meclizine (ANTIVERT),  albuterol (PROAIR HFA)  Please bring all inhalers with you the day of surgery  Follow your surgeon's instructions on when to stop Aspirin.  If no instructions were given by your surgeon then you will need to call the office to get those instructions.    7 days prior to surgery STOP taking any  Aleve, Naproxen ( Naprosyn), Ibuprofen, Motrin, Advil, Goody's, BC's, all herbal medications, fish oil, and all vitamins.    The Morning of Surgery  Do not wear jewelry, make-up or nail polish.  Do not wear lotions, powders, or perfumes/colognes, or deodorant  Do not shave 48 hours prior to surgery.  Men may shave face and neck.  Do not bring valuables to the hospital.  [facility] is not responsible for any belongings or valuables.  If you are a smoker, DO NOT Smoke 24 hours prior to surgery IF you wear a CPAP at night please bring your mask, tubing, and machine the morning of surgery   Remember that you must have someone to transport you home after your surgery, and remain with you for 24 hours if you are discharged the same day.   Contacts, glasses, hearing aids, dentures or bridgework may not be worn into surgery.    Leave your suitcase in the car.  After  surgery it may be brought to your room.  For patients admitted to the hospital, discharge time will be determined by your treatment team.  Patients discharged the day of surgery will not be allowed to drive home.    Special instructions:   [place]- Preparing For Surgery  Before surgery, you can play an important role. Because skin is not sterile, your skin needs to be as free of germs as possible. You can reduce the number of germs on your skin by washing with CHG (chlorahexidine gluconate) Soap before surgery.  CHG is an antiseptic cleaner which kills germs and bonds with the skin to continue killing germs even after washing.    Oral Hygiene is also important to reduce your risk of infection.  Remember - BRUSH YOUR TEETH THE MORNING OF SURGERY WITH YOUR REGULAR TOOTHPASTE  Please do not use if you have an allergy to CHG or antibacterial soaps. If your skin becomes reddened/irritated stop using the CHG.  Do not shave (including legs and underarms) for at least 48 hours prior to first CHG shower. It is OK to shave your face.  Please follow these instructions carefully.   1. Shower the NIGHT BEFORE SURGERY and the MORNING OF SURGERY with CHG Soap.   2. If you chose to wash your hair, wash your hair first as usual with your normal shampoo.  3. After you shampoo, rinse your hair and  body thoroughly to remove the shampoo.  4. Use CHG as you would any other liquid soap. You can apply CHG directly to the skin and wash gently with a scrungie or a clean washcloth.   5. Apply the CHG Soap to your body ONLY FROM THE NECK DOWN.  Do not use on open wounds or open sores. Avoid contact with your eyes, ears, mouth and genitals (private parts). Wash Face and genitals (private parts)  with your normal soap.   6. Wash thoroughly, paying special attention to the area where your surgery will be performed.  7. Thoroughly rinse your body with warm water from the neck down.  8. DO NOT shower/wash with  your normal soap after using and rinsing off the CHG Soap.  9. Pat yourself dry with a CLEAN TOWEL.  10. Wear CLEAN PAJAMAS to bed the night before surgery, wear comfortable clothes the morning of surgery  11. Place CLEAN SHEETS on your bed the night of your first shower and DO NOT SLEEP WITH PETS.    Day of Surgery:  Do not apply any deodorants/lotions.  Please wear clean clothes to the hospital/surgery center.   Remember to brush your teeth WITH YOUR REGULAR TOOTHPASTE.   Please read over the following fact sheets that you were given.

## 2018-06-28 NOTE — Progress Notes (Signed)
Pt denies SOB, chest pain, and being under the care of a cardiologist.  Pt stated that he is under the care of Dr. Burr Medico at [facility]. Pt denies having a chest x ray and EKG within the last year. Pt denies recent labs. Dr. Lear Ng sent [facility] Sleep Apnea Screening results.  Pt denies that he and family members tested positive fore COVID-19.  Pt denies that he and family members experienced the following symptoms:  Cough yes/no: No Fever (>100.55F)  yes/no: No Runny nose yes/no: No Sore throat yes/no: No Difficulty breathing/shortness of breath  yes/no: No  Have you or a family member traveled in the last 14 days and where? yes/no: No  Pt reminded that hospital visitation restrictions are in effect and the importance of the restrictions.   Pt verbalized understanding of all pre-op instructions.   Nurse lvm with Sherrie, Surgical Coordinator, to make MD aware that pt stated that he is a Jehovah's Witness and refuses blood transfusions.  Pt chart forwarded to PA, Anesthesiology, for review; see note

## 2018-06-29 ENCOUNTER — Ambulatory Visit: Payer: Self-pay | Admitting: Orthopedic Surgery

## 2018-06-29 NOTE — H&P (Deleted)
  The note originally documented on this encounter has been moved the the encounter in which it belongs.  

## 2018-06-29 NOTE — H&P (Signed)
Subjective:   Spencer Buckley is a very pleasant [age] year old gentleman who had a lumbar discectomy in 2004 and has had ongoing significant back pain over the last 10 years. He last had a series of epidural injections of various sorts in 2017. He states that despite exercise and self directed therapy continues to have debilitating pain. An updated MRI demonstrate severe right facet arthrosis with significant effacement in the lateral recess which is causing displacement of the right S1 nerve root. This is also the level where he had a previous right L5 hemilaminotomy. Despite injection therapy and self directed exercise, the pain has not improved and it continues to be debilitating and altering his quality of life.   Patient Active Problem List   Diagnosis Date Noted  . Left-sided chest wall pain 01/06/2016  . Occupational asthma with acute exacerbation 01/06/2016  . Dyspnea 12/16/2015   Past Medical History:  Diagnosis Date  . Anxiety   . Arthritis    of facet joint of lumbar spine  . Chest pain   . Chronic headaches   . Dysphagia   . ED (erectile dysfunction)   . Environmental and seasonal allergies   . Hepatitis    Hep A in 80's  . Hypercholesterolemia   . Hypertension   . Myalgia   . SOB (shortness of breath)   . Spinal stenosis   . Vertigo   . Vitamin D deficiency   . Wears glasses     Past Surgical History:  Procedure Laterality Date  . BACK SURGERY Right   . CARDIAC CATHETERIZATION N/A 11/30/2015   Procedure: Left Heart Cath and Coronary Angiography;  Surgeon: Wendall StadePeter C Nishan, MD;  [facility];  Service: Cardiovascular;  Laterality: N/A;  . KNEE SURGERY    . SHOULDER SURGERY      Current Outpatient Medications  Medication Sig Dispense Refill Last Dose  . albuterol (PROAIR HFA) 108 (90 Base) MCG/ACT inhaler Inhale 2 puffs into the lungs every 6 (six) hours as needed for wheezing or shortness of breath. 1 Inhaler 3 Unknown at Unknown time  . aspirin EC 81 MG  tablet Take 81-162 mg by mouth daily as needed (pain).    03/08/2018 at Unknown time  . atorvastatin (LIPITOR) 20 MG tablet Take 20 mg by mouth daily.     . baclofen (LIORESAL) 10 MG tablet Take 10 mg by mouth 3 (three) times daily.     . fluticasone (FLONASE) 50 MCG/ACT nasal spray Place 1 spray into both nostrils 2 (two) times daily as needed for allergies or rhinitis.     Marland Kitchen. meclizine (ANTIVERT) 25 MG tablet Take 25 mg by mouth 3 (three) times daily as needed for dizziness.     . naproxen (NAPROSYN) 500 MG tablet Take 500 mg by mouth 2 (two) times daily with a meal.     . Oxycodone HCl 10 MG TABS Take 10 mg by mouth 3 (three) times daily.     . sildenafil (VIAGRA) 100 MG tablet Take 100 mg by mouth daily as needed for erectile dysfunction.     . valsartan-hydrochlorothiazide (DIOVAN-HCT) 320-25 MG tablet Take 1 tablet by mouth daily.     . Vitamin D, Ergocalciferol, (DRISDOL) 1.25 MG (50000 UT) CAPS capsule Take 50,000 Units by mouth every 7 (seven) days. Friday      No current facility-administered medications for this visit.    No Known Allergies  Social History   Tobacco Use  . Smoking status: Never Smoker  . Smokeless  tobacco: Never Used  Substance Use Topics  . Alcohol use: Yes    Alcohol/week: 0.0 standard drinks    Comment: occ    Family History  Problem Relation Age of Onset  . Hypertension Mother   . Hypertension Father   . Hyperlipidemia Father     Review of Systems As stated in HPI  Objective:   Clinical exam: Patient is alert and oriented 3. No shortness of breath, chest pain.  Ambulates with an antalgic gait. No assistive devices.   Cardio: RRR, no rubs, murmers, or gallops  Lungs: CTAB  Abdomen: BSx4, Soft and nontender. Non-distended. No hepatosplenomegaly. No loss of bowel and bladder control, no rebound tenderness.  Lumbar spine: Ongoing primary low back pain. Pain does radiate occasionally into the lateral aspect of the right thigh into the anterior  aspect of the thigh. Rarely will go below the level of the knee. Pain is worse with flexion extension and rotation of the lumbar spine. No SI joint pain with direct palpation.  Neuro: No focal neurological deficits on exam except for intermittent dysesthesias primarily in the right thigh and occasionally into the right calf. Negative Babinski test. 1+ deep tendon reflexes symmetrically at the knee and Achilles.  Vascular: Lower extremity peripheral pulses are 2+ dorsalis pedis/posterior tibialis pulses bilaterally. Compartments are soft and nontender  X-rays demonstrate slight anterior listhesis at L4-5 and mild degenerative changes at L4-5. No scoliosis no acute fracture seen.  Lumbar MRI dated 04/18/18 does demonstrate severe right facet arthrosis with significant effacement in the lateral recess which is causing displacement of the right S1 nerve root. This is also the level where he had a previous right L5 hemilaminotomy.  Assessment:   Spencer Buckley is a very pleasant 53 year old gentleman who had a lumbar discectomy in 2004 and has had ongoing significant back pain over the last 10 years. He last had a series of epidural injections of various sorts in 2017. He states that despite exercise and self directed therapy continues to have debilitating pain. On exam, he has no focal motor deficits. An updated MRI demonstrate severe right facet arthrosis with significant effacement in the lateral recess which is causing displacement of the right S1 nerve root. This is also the level where he had a previous right L5 hemilaminotomy. He would like to move forward with surgical intervention.  Plan:   The patient is here for his H&P. He is scheduled for revision L5-S1 decompression [facility] Dr[facility]Brooks [facility]  Risks and benefits of surgery were discussed with the patient. These include: Infection, bleeding, death, stroke, paralysis, ongoing or worse pain, need for additional surgery, leak of spinal fluid,  adjacent segment degeneration requiring additional surgery, post-operative hematoma formation that can result in neurological compromise and the need for urgent/emergent re-operation. Loss in bowel and bladder control. Injury to major vessels that could result in the need for urgent abdominal surgery to stop bleeding. Risk of deep venous thrombosis (DVT) and the need for additional treatment. Recurrent disc herniation resulting in the need for revision surgery, which could include fusion surgery (utilizing instrumentation such as pedicle screws and intervertebral cages).  Additional risk: If instrumentation is used there is a risk of migration, or breakage of that hardware that could require additional surgery.  We have also discussed the goals of surgery to include: Goals of surgery: Reduction in pain, and improvement in quality of life.  We have also discussed the post-operative recovery period to include: bathing/showering restrictions, wound healing, activity (and  driving) restrictions, medications/pain mangement.  We have also discussed post-operative redflags to include: signs and symptoms of postoperative infection, DVT/PE.

## 2018-06-29 NOTE — Anesthesia Preprocedure Evaluation (Addendum)
Anesthesia Evaluation  Patient identified by MRN, date of birth, ID band Patient awake    Reviewed: Allergy & Precautions, NPO status , Patient's Chart, lab work & pertinent test results  Airway Mallampati: II  TM Distance: >3 FB Neck ROM: Full    Dental  (+) Dental Advisory Given, Teeth Intact   Pulmonary shortness of breath, asthma ,    Pulmonary exam normal breath sounds clear to auscultation       Cardiovascular hypertension, Pt. on medications Normal cardiovascular exam Rhythm:Regular Rate:Normal     Neuro/Psych  Headaches, negative psych ROS   GI/Hepatic negative GI ROS, (+) Hepatitis -, A  Endo/Other  negative endocrine ROS  Renal/GU negative Renal ROS     Musculoskeletal  (+) Arthritis ,   Abdominal (+) + obese,   Peds  Hematology negative hematology ROS (+)   Anesthesia Other Findings   Reproductive/Obstetrics negative OB ROS                           Anesthesia Physical Anesthesia Plan  ASA: III  Anesthesia Plan: General   Post-op Pain Management:    Induction: Intravenous  PONV Risk Score and Plan: Ondansetron, Dexamethasone, Midazolam and Treatment may vary due to age or medical condition  Airway Management Planned: Oral ETT  Additional Equipment: None  Intra-op Plan:   Post-operative Plan: Extubation in OR  Informed Consent: I have reviewed the patients History and Physical, chart, labs and discussed the procedure including the risks, benefits and alternatives for the proposed anesthesia with the patient or authorized representative who has indicated his/her understanding and acceptance.     Dental advisory given  Plan Discussed with: CRNA  Anesthesia Plan Comments: (PAT note written by Myra Gianotti, PA-C. )       Anesthesia Quick Evaluation

## 2018-06-30 ENCOUNTER — Other Ambulatory Visit (HOSPITAL_COMMUNITY)
Admission: RE | Admit: 2018-06-30 | Discharge: 2018-06-30 | Disposition: A | Discharge status: Home / Self Care | Payer: Medicare HMO | Source: Ambulatory Visit | Attending: Orthopedic Surgery | Admitting: Orthopedic Surgery

## 2018-06-30 DIAGNOSIS — Z01818 Encounter for other preprocedural examination: Secondary | ICD-10-CM | POA: Diagnosis not present

## 2018-06-30 LAB — SARS CORONAVIRUS 2 (TAT 6-24 HRS): SARS Coronavirus 2: NEGATIVE

## 2018-07-03 MED ORDER — DEXTROSE 5 % IV SOLN
3.0000 g | INTRAVENOUS | Status: AC
  Administered 2018-07-04: 3 g via INTRAVENOUS
  Filled 2018-07-03: qty 3

## 2018-07-04 ENCOUNTER — Ambulatory Visit (HOSPITAL_COMMUNITY)
Admission: RE | Disposition: A | Discharge status: Home / Self Care | Payer: Self-pay | Source: Home / Self Care | Attending: Orthopedic Surgery

## 2018-07-04 ENCOUNTER — Other Ambulatory Visit: Payer: Self-pay

## 2018-07-04 ENCOUNTER — Encounter (HOSPITAL_COMMUNITY): Payer: Self-pay

## 2018-07-04 ENCOUNTER — Ambulatory Visit (HOSPITAL_COMMUNITY): Payer: Medicare HMO

## 2018-07-04 ENCOUNTER — Ambulatory Visit (HOSPITAL_COMMUNITY): Payer: Medicare HMO | Admitting: Anesthesiology

## 2018-07-04 ENCOUNTER — Observation Stay (HOSPITAL_COMMUNITY)
Admission: RE | Admit: 2018-07-04 | Discharge: 2018-07-05 | Disposition: A | Discharge status: Home / Self Care | Payer: Medicare HMO | Attending: Orthopedic Surgery | Admitting: Orthopedic Surgery

## 2018-07-04 ENCOUNTER — Ambulatory Visit (HOSPITAL_COMMUNITY): Payer: Medicare HMO | Admitting: Vascular Surgery

## 2018-07-04 DIAGNOSIS — Z791 Long term (current) use of non-steroidal anti-inflammatories (NSAID): Secondary | ICD-10-CM | POA: Diagnosis not present

## 2018-07-04 DIAGNOSIS — I10 Essential (primary) hypertension: Secondary | ICD-10-CM | POA: Diagnosis not present

## 2018-07-04 DIAGNOSIS — M4696 Unspecified inflammatory spondylopathy, lumbar region: Secondary | ICD-10-CM | POA: Diagnosis not present

## 2018-07-04 DIAGNOSIS — M2578 Osteophyte, vertebrae: Secondary | ICD-10-CM | POA: Insufficient documentation

## 2018-07-04 DIAGNOSIS — Z7982 Long term (current) use of aspirin: Secondary | ICD-10-CM | POA: Insufficient documentation

## 2018-07-04 DIAGNOSIS — J45909 Unspecified asthma, uncomplicated: Secondary | ICD-10-CM | POA: Insufficient documentation

## 2018-07-04 DIAGNOSIS — M4800 Spinal stenosis, site unspecified: Secondary | ICD-10-CM | POA: Diagnosis present

## 2018-07-04 DIAGNOSIS — Z79899 Other long term (current) drug therapy: Secondary | ICD-10-CM | POA: Insufficient documentation

## 2018-07-04 DIAGNOSIS — M4807 Spinal stenosis, lumbosacral region: Secondary | ICD-10-CM | POA: Diagnosis present

## 2018-07-04 DIAGNOSIS — Z419 Encounter for procedure for purposes other than remedying health state, unspecified: Secondary | ICD-10-CM

## 2018-07-04 DIAGNOSIS — E7800 Pure hypercholesterolemia, unspecified: Secondary | ICD-10-CM | POA: Insufficient documentation

## 2018-07-04 HISTORY — PX: LUMBAR LAMINECTOMY/DECOMPRESSION MICRODISCECTOMY: SHX5026

## 2018-07-04 SURGERY — LUMBAR LAMINECTOMY/DECOMPRESSION MICRODISCECTOMY 1 LEVEL
Anesthesia: General | Site: Back | Laterality: Right

## 2018-07-04 MED ORDER — METHYLPREDNISOLONE ACETATE 40 MG/ML IJ SUSP
INTRAMUSCULAR | Status: AC
  Filled 2018-07-04: qty 1

## 2018-07-04 MED ORDER — PROPOFOL 10 MG/ML IV BOLUS
INTRAVENOUS | Status: DC | PRN
  Administered 2018-07-04: 200 mg via INTRAVENOUS

## 2018-07-04 MED ORDER — MEPERIDINE HCL 25 MG/ML IJ SOLN: 6.2500 mg | INTRAMUSCULAR | Status: DC | PRN

## 2018-07-04 MED ORDER — CEFAZOLIN SODIUM-DEXTROSE 2-4 GM/100ML-% IV SOLN
2.0000 g | Freq: Three times a day (TID) | INTRAVENOUS | Status: AC
  Administered 2018-07-04 – 2018-07-05 (×2): 2 g via INTRAVENOUS
  Filled 2018-07-04 (×3): qty 100

## 2018-07-04 MED ORDER — GABAPENTIN 300 MG PO CAPS
300.0000 mg | ORAL_CAPSULE | Freq: Three times a day (TID) | ORAL | Status: DC
  Administered 2018-07-04 – 2018-07-05 (×3): 300 mg via ORAL
  Filled 2018-07-04 (×3): qty 1

## 2018-07-04 MED ORDER — MIDAZOLAM HCL 2 MG/2ML IJ SOLN
INTRAMUSCULAR | Status: AC
  Filled 2018-07-04: qty 2

## 2018-07-04 MED ORDER — HYDROCHLOROTHIAZIDE 25 MG PO TABS
25.0000 mg | ORAL_TABLET | Freq: Every day | ORAL | Status: DC
  Administered 2018-07-04 – 2018-07-05 (×2): 25 mg via ORAL
  Filled 2018-07-04 (×2): qty 1

## 2018-07-04 MED ORDER — SODIUM CHLORIDE 0.9% FLUSH
3.0000 mL | Freq: Two times a day (BID) | INTRAVENOUS | Status: DC
  Administered 2018-07-04: 21:00:00 3 mL via INTRAVENOUS

## 2018-07-04 MED ORDER — SUGAMMADEX SODIUM 200 MG/2ML IV SOLN
INTRAVENOUS | Status: DC | PRN
  Administered 2018-07-04: 400 mg via INTRAVENOUS

## 2018-07-04 MED ORDER — PROMETHAZINE HCL 25 MG/ML IJ SOLN: 6.2500 mg | INTRAMUSCULAR | Status: DC | PRN

## 2018-07-04 MED ORDER — ACETAMINOPHEN 10 MG/ML IV SOLN
INTRAVENOUS | Status: DC | PRN
  Administered 2018-07-04: 1000 mg via INTRAVENOUS

## 2018-07-04 MED ORDER — BUPIVACAINE-EPINEPHRINE (PF) 0.25% -1:200000 IJ SOLN
INTRAMUSCULAR | Status: AC
  Filled 2018-07-04: qty 30

## 2018-07-04 MED ORDER — LIDOCAINE HCL (CARDIAC) PF 100 MG/5ML IV SOSY
PREFILLED_SYRINGE | INTRAVENOUS | Status: DC | PRN
  Administered 2018-07-04: 100 mg via INTRAVENOUS

## 2018-07-04 MED ORDER — SODIUM CHLORIDE 0.9% FLUSH: 3.0000 mL | INTRAVENOUS | Status: DC | PRN

## 2018-07-04 MED ORDER — METHOCARBAMOL 1000 MG/10ML IJ SOLN
500.0000 mg | Freq: Four times a day (QID) | INTRAVENOUS | Status: DC | PRN
  Filled 2018-07-04: qty 5

## 2018-07-04 MED ORDER — THROMBIN 20000 UNITS EX SOLR
CUTANEOUS | Status: DC | PRN
  Administered 2018-07-04: 09:00:00 20 mL

## 2018-07-04 MED ORDER — THROMBIN (RECOMBINANT) 20000 UNITS EX SOLR
CUTANEOUS | Status: AC
  Filled 2018-07-04: qty 20000

## 2018-07-04 MED ORDER — HYDROMORPHONE HCL 1 MG/ML IJ SOLN
0.2500 mg | INTRAMUSCULAR | Status: DC | PRN
  Administered 2018-07-04 (×2): 0.25 mg via INTRAVENOUS
  Administered 2018-07-04: 13:00:00 0.5 mg via INTRAVENOUS

## 2018-07-04 MED ORDER — IRBESARTAN 300 MG PO TABS
300.0000 mg | ORAL_TABLET | Freq: Every day | ORAL | Status: DC
  Administered 2018-07-05: 11:00:00 300 mg via ORAL
  Filled 2018-07-04 (×2): qty 1

## 2018-07-04 MED ORDER — SUCCINYLCHOLINE CHLORIDE 20 MG/ML IJ SOLN
INTRAMUSCULAR | Status: DC | PRN
  Administered 2018-07-04: 140 mg via INTRAVENOUS

## 2018-07-04 MED ORDER — ATORVASTATIN CALCIUM 10 MG PO TABS
20.0000 mg | ORAL_TABLET | Freq: Every day | ORAL | Status: DC
  Administered 2018-07-04 – 2018-07-05 (×2): 20 mg via ORAL
  Filled 2018-07-04 (×2): qty 2

## 2018-07-04 MED ORDER — MENTHOL 3 MG MT LOZG: 1.0000 | LOZENGE | OROMUCOSAL | Status: DC | PRN

## 2018-07-04 MED ORDER — ONDANSETRON HCL 4 MG PO TABS: 4.0000 mg | ORAL_TABLET | Freq: Three times a day (TID) | ORAL | 0 refills | Status: AC | PRN

## 2018-07-04 MED ORDER — HEMOSTATIC AGENTS (NO CHARGE) OPTIME
TOPICAL | Status: DC | PRN
  Administered 2018-07-04: 1

## 2018-07-04 MED ORDER — METHOCARBAMOL 500 MG PO TABS: 500.0000 mg | ORAL_TABLET | Freq: Three times a day (TID) | ORAL | 0 refills | Status: AC | PRN

## 2018-07-04 MED ORDER — FENTANYL CITRATE (PF) 100 MCG/2ML IJ SOLN
INTRAMUSCULAR | Status: DC | PRN
  Administered 2018-07-04: 50 ug via INTRAVENOUS
  Administered 2018-07-04 (×3): 100 ug via INTRAVENOUS
  Administered 2018-07-04 (×3): 50 ug via INTRAVENOUS

## 2018-07-04 MED ORDER — MIDAZOLAM HCL 5 MG/5ML IJ SOLN
INTRAMUSCULAR | Status: DC | PRN
  Administered 2018-07-04: 2 mg via INTRAVENOUS

## 2018-07-04 MED ORDER — LACTATED RINGERS IV SOLN
INTRAVENOUS | Status: DC | PRN
  Administered 2018-07-04 (×2): via INTRAVENOUS

## 2018-07-04 MED ORDER — MORPHINE SULFATE (PF) 2 MG/ML IV SOLN
2.0000 mg | INTRAVENOUS | Status: AC | PRN
  Administered 2018-07-04 (×2): 2 mg via INTRAVENOUS
  Filled 2018-07-04 (×2): qty 1

## 2018-07-04 MED ORDER — ONDANSETRON HCL 4 MG/2ML IJ SOLN
INTRAMUSCULAR | Status: DC | PRN
  Administered 2018-07-04: 4 mg via INTRAVENOUS

## 2018-07-04 MED ORDER — DEXAMETHASONE SODIUM PHOSPHATE 10 MG/ML IJ SOLN
INTRAMUSCULAR | Status: DC | PRN
  Administered 2018-07-04: 10 mg via INTRAVENOUS

## 2018-07-04 MED ORDER — ALBUTEROL SULFATE HFA 108 (90 BASE) MCG/ACT IN AERS: 2.0000 | INHALATION_SPRAY | Freq: Four times a day (QID) | RESPIRATORY_TRACT | Status: DC | PRN

## 2018-07-04 MED ORDER — DOCUSATE SODIUM 100 MG PO CAPS
100.0000 mg | ORAL_CAPSULE | Freq: Two times a day (BID) | ORAL | Status: DC
  Administered 2018-07-04 – 2018-07-05 (×2): 100 mg via ORAL
  Filled 2018-07-04 (×2): qty 1

## 2018-07-04 MED ORDER — PHENYLEPHRINE HCL (PRESSORS) 10 MG/ML IV SOLN
INTRAVENOUS | Status: DC | PRN
  Administered 2018-07-04 (×3): 80 ug via INTRAVENOUS

## 2018-07-04 MED ORDER — METHOCARBAMOL 500 MG PO TABS
500.0000 mg | ORAL_TABLET | Freq: Four times a day (QID) | ORAL | Status: DC | PRN
  Administered 2018-07-04 – 2018-07-05 (×4): 500 mg via ORAL
  Filled 2018-07-04 (×4): qty 1

## 2018-07-04 MED ORDER — OXYCODONE HCL 5 MG PO TABS
10.0000 mg | ORAL_TABLET | ORAL | Status: DC | PRN
  Administered 2018-07-04: 13:00:00 10 mg via ORAL

## 2018-07-04 MED ORDER — PROPOFOL 10 MG/ML IV BOLUS
INTRAVENOUS | Status: AC
  Filled 2018-07-04: qty 20

## 2018-07-04 MED ORDER — FENTANYL CITRATE (PF) 250 MCG/5ML IJ SOLN
INTRAMUSCULAR | Status: AC
  Filled 2018-07-04: qty 5

## 2018-07-04 MED ORDER — HYDROMORPHONE HCL 1 MG/ML IJ SOLN
INTRAMUSCULAR | Status: AC
  Administered 2018-07-04: 0.5 mg via INTRAVENOUS
  Filled 2018-07-04: qty 1

## 2018-07-04 MED ORDER — ONDANSETRON HCL 4 MG PO TABS: 4.0000 mg | ORAL_TABLET | Freq: Four times a day (QID) | ORAL | Status: DC | PRN

## 2018-07-04 MED ORDER — LACTATED RINGERS IV SOLN: INTRAVENOUS | Status: DC

## 2018-07-04 MED ORDER — PHENOL 1.4 % MT LIQD: 1.0000 | OROMUCOSAL | Status: DC | PRN

## 2018-07-04 MED ORDER — MECLIZINE HCL 25 MG PO TABS
25.0000 mg | ORAL_TABLET | Freq: Three times a day (TID) | ORAL | Status: DC | PRN
  Filled 2018-07-04: qty 1

## 2018-07-04 MED ORDER — SUCCINYLCHOLINE CHLORIDE 200 MG/10ML IV SOSY
PREFILLED_SYRINGE | INTRAVENOUS | Status: AC
  Filled 2018-07-04: qty 10

## 2018-07-04 MED ORDER — OXYCODONE HCL 5 MG PO TABS
ORAL_TABLET | ORAL | Status: AC
  Filled 2018-07-04: qty 2

## 2018-07-04 MED ORDER — SODIUM CHLORIDE 0.9 % IV SOLN
INTRAVENOUS | Status: DC | PRN
  Administered 2018-07-04: 40 ug/min via INTRAVENOUS

## 2018-07-04 MED ORDER — TRANEXAMIC ACID-NACL 1000-0.7 MG/100ML-% IV SOLN
INTRAVENOUS | Status: DC | PRN
  Administered 2018-07-04: 1000 mg via INTRAVENOUS

## 2018-07-04 MED ORDER — OXYCODONE HCL 5 MG PO TABS
5.0000 mg | ORAL_TABLET | ORAL | Status: DC | PRN
  Administered 2018-07-04 – 2018-07-05 (×5): 5 mg via ORAL
  Filled 2018-07-04 (×5): qty 1

## 2018-07-04 MED ORDER — ALBUTEROL SULFATE (2.5 MG/3ML) 0.083% IN NEBU: 2.5000 mg | INHALATION_SOLUTION | Freq: Four times a day (QID) | RESPIRATORY_TRACT | Status: DC | PRN

## 2018-07-04 MED ORDER — 0.9 % SODIUM CHLORIDE (POUR BTL) OPTIME
TOPICAL | Status: DC | PRN
  Administered 2018-07-04: 1000 mL

## 2018-07-04 MED ORDER — LIDOCAINE 2% (20 MG/ML) 5 ML SYRINGE
INTRAMUSCULAR | Status: AC
  Filled 2018-07-04: qty 5

## 2018-07-04 MED ORDER — ACETAMINOPHEN 10 MG/ML IV SOLN
INTRAVENOUS | Status: AC
  Filled 2018-07-04: qty 100

## 2018-07-04 MED ORDER — FLUTICASONE PROPIONATE 50 MCG/ACT NA SUSP
1.0000 | Freq: Two times a day (BID) | NASAL | Status: DC | PRN
  Filled 2018-07-04: qty 16

## 2018-07-04 MED ORDER — VALSARTAN-HYDROCHLOROTHIAZIDE 320-25 MG PO TABS: 1.0000 | ORAL_TABLET | Freq: Every day | ORAL | Status: DC

## 2018-07-04 MED ORDER — OXYCODONE-ACETAMINOPHEN 10-325 MG PO TABS: 1.0000 | ORAL_TABLET | Freq: Four times a day (QID) | ORAL | 0 refills | Status: AC | PRN

## 2018-07-04 MED ORDER — BUPIVACAINE-EPINEPHRINE 0.25% -1:200000 IJ SOLN
INTRAMUSCULAR | Status: DC | PRN
  Administered 2018-07-04: 28 mL

## 2018-07-04 MED ORDER — ACETAMINOPHEN 650 MG RE SUPP: 650.0000 mg | RECTAL | Status: DC | PRN

## 2018-07-04 MED ORDER — TRANEXAMIC ACID-NACL 1000-0.7 MG/100ML-% IV SOLN
INTRAVENOUS | Status: AC
  Filled 2018-07-04: qty 100

## 2018-07-04 MED ORDER — ACETAMINOPHEN 325 MG PO TABS: 650.0000 mg | ORAL_TABLET | ORAL | Status: DC | PRN

## 2018-07-04 MED ORDER — ROCURONIUM BROMIDE 100 MG/10ML IV SOLN
INTRAVENOUS | Status: DC | PRN
  Administered 2018-07-04: 100 mg via INTRAVENOUS

## 2018-07-04 MED ORDER — ONDANSETRON HCL 4 MG/2ML IJ SOLN: 4.0000 mg | Freq: Four times a day (QID) | INTRAMUSCULAR | Status: DC | PRN

## 2018-07-04 MED ORDER — ONDANSETRON HCL 4 MG/2ML IJ SOLN
INTRAMUSCULAR | Status: AC
  Filled 2018-07-04: qty 2

## 2018-07-04 SURGICAL SUPPLY — 58 items
BNDG GAUZE ELAST 4 BULKY (GAUZE/BANDAGES/DRESSINGS) ×3 IMPLANT
CANISTER SUCT 3000ML PPV (MISCELLANEOUS) ×3 IMPLANT
CLOSURE STERI-STRIP 1/2X4 (GAUZE/BANDAGES/DRESSINGS) ×1
CLSR STERI-STRIP ANTIMIC 1/2X4 (GAUZE/BANDAGES/DRESSINGS) ×2 IMPLANT
CORD BI POLAR (MISCELLANEOUS) ×3 IMPLANT
COVER SURGICAL LIGHT HANDLE (MISCELLANEOUS) ×3 IMPLANT
COVER WAND RF STERILE (DRAPES) ×3 IMPLANT
DRAPE POUCH INSTRU U-SHP 10X18 (DRAPES) ×3 IMPLANT
DRAPE SURG 17X23 STRL (DRAPES) ×3 IMPLANT
DRAPE U-SHAPE 47X51 STRL (DRAPES) ×3 IMPLANT
DRSG OPSITE POSTOP 3X4 (GAUZE/BANDAGES/DRESSINGS) ×3 IMPLANT
DRSG OPSITE POSTOP 4X6 (GAUZE/BANDAGES/DRESSINGS) ×3 IMPLANT
DURAPREP 26ML APPLICATOR (WOUND CARE) ×3 IMPLANT
ELECT BLADE 4.0 EZ CLEAN MEGAD (MISCELLANEOUS) ×3
ELECT CAUTERY BLADE 6.4 (BLADE) ×3 IMPLANT
ELECT PENCIL ROCKER SW 15FT (MISCELLANEOUS) ×3 IMPLANT
ELECT REM PT RETURN 9FT ADLT (ELECTROSURGICAL) ×3
ELECTRODE BLDE 4.0 EZ CLN MEGD (MISCELLANEOUS) ×1 IMPLANT
ELECTRODE REM PT RTRN 9FT ADLT (ELECTROSURGICAL) ×1 IMPLANT
EVACUATOR SILICONE 100CC (DRAIN) IMPLANT
GLOVE BIO SURGEON STRL SZ 6.5 (GLOVE) ×2 IMPLANT
GLOVE BIO SURGEONS STRL SZ 6.5 (GLOVE) ×1
GLOVE BIOGEL PI IND STRL 6.5 (GLOVE) ×1 IMPLANT
GLOVE BIOGEL PI IND STRL 8.5 (GLOVE) ×1 IMPLANT
GLOVE BIOGEL PI INDICATOR 6.5 (GLOVE) ×2
GLOVE BIOGEL PI INDICATOR 8.5 (GLOVE) ×2
GLOVE SS BIOGEL STRL SZ 8.5 (GLOVE) ×1 IMPLANT
GLOVE SUPERSENSE BIOGEL SZ 8.5 (GLOVE) ×2
GOWN STRL REUS W/ TWL LRG LVL3 (GOWN DISPOSABLE) ×2 IMPLANT
GOWN STRL REUS W/TWL 2XL LVL3 (GOWN DISPOSABLE) ×3 IMPLANT
GOWN STRL REUS W/TWL LRG LVL3 (GOWN DISPOSABLE) ×4
KIT BASIN OR (CUSTOM PROCEDURE TRAY) ×3 IMPLANT
KIT TURNOVER KIT B (KITS) ×3 IMPLANT
NEEDLE 22X1 1/2 (OR ONLY) (NEEDLE) ×3 IMPLANT
NEEDLE SPNL 18GX3.5 QUINCKE PK (NEEDLE) ×6 IMPLANT
NS IRRIG 1000ML POUR BTL (IV SOLUTION) ×3 IMPLANT
PACK LAMINECTOMY ORTHO (CUSTOM PROCEDURE TRAY) ×3 IMPLANT
PACK UNIVERSAL I (CUSTOM PROCEDURE TRAY) ×3 IMPLANT
PAD ARMBOARD 7.5X6 YLW CONV (MISCELLANEOUS) ×6 IMPLANT
PATTIES SURGICAL .25X.25 (GAUZE/BANDAGES/DRESSINGS) ×6 IMPLANT
PATTIES SURGICAL .5 X.5 (GAUZE/BANDAGES/DRESSINGS) ×3 IMPLANT
PATTIES SURGICAL .5 X1 (DISPOSABLE) ×3 IMPLANT
SPONGE SURGIFOAM ABS GEL 100 (HEMOSTASIS) ×3 IMPLANT
SURGIFLO W/THROMBIN 8M KIT (HEMOSTASIS) ×3 IMPLANT
SUT BONE WAX W31G (SUTURE) ×3 IMPLANT
SUT MON AB 3-0 SH 27 (SUTURE) ×2
SUT MON AB 3-0 SH27 (SUTURE) ×1 IMPLANT
SUT VIC AB 0 CT1 27 (SUTURE) ×2
SUT VIC AB 0 CT1 27XBRD ANBCTR (SUTURE) ×1 IMPLANT
SUT VIC AB 1 CT1 18XCR BRD 8 (SUTURE) ×1 IMPLANT
SUT VIC AB 1 CT1 8-18 (SUTURE) ×2
SUT VIC AB 2-0 CT1 18 (SUTURE) ×3 IMPLANT
SYR BULB IRRIGATION 50ML (SYRINGE) ×3 IMPLANT
SYR CONTROL 10ML LL (SYRINGE) ×6 IMPLANT
TOWEL GREEN STERILE (TOWEL DISPOSABLE) ×3 IMPLANT
TOWEL GREEN STERILE FF (TOWEL DISPOSABLE) ×3 IMPLANT
WATER STERILE IRR 1000ML POUR (IV SOLUTION) ×3 IMPLANT
YANKAUER SUCT BULB TIP NO VENT (SUCTIONS) ×3 IMPLANT

## 2018-07-04 NOTE — Anesthesia Procedure Notes (Signed)
Procedure Name: Intubation Date/Time: 07/04/2018 8:40 AM Performed by: Jaymon Dudek T, CRNA Pre-anesthesia Checklist: Patient identified, Emergency Drugs available, Suction available and Patient being monitored Patient Re-evaluated:Patient Re-evaluated prior to induction Oxygen Delivery Method: Circle system utilized Preoxygenation: Pre-oxygenation with 100% oxygen Induction Type: IV induction and Rapid sequence Laryngoscope Size: Mac and 4 Grade View: Grade II Tube type: Oral Tube size: 7.5 mm Number of attempts: 1 Airway Equipment and Method: Patient positioned with wedge pillow and Stylet Placement Confirmation: ETT inserted through vocal cords under direct vision,  positive ETCO2 and breath sounds checked- equal and bilateral Secured at: 23 cm Tube secured with: Tape Dental Injury: Teeth and Oropharynx as per pre-operative assessment

## 2018-07-04 NOTE — Brief Op Note (Signed)
07/04/2018  12:01 PM  PATIENT:  Spencer Buckley  53 y.o. 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

## 2018-07-04 NOTE — Transfer of Care (Signed)
Immediate Anesthesia Transfer of Care Note  Patient: Spencer Buckley  Procedure(s) Performed: Revision L5-S1 right gill decompression (Right Back)  Patient Location: PACU  Anesthesia Type:General  Level of Consciousness: awake, patient cooperative and responds to stimulation  Airway & Oxygen Therapy: Patient Spontanous Breathing and Patient connected to nasal cannula oxygen  Post-op Assessment: Report given to RN, Post -op Vital signs reviewed and stable and Patient moving all extremities X 4  Post vital signs: Reviewed and stable  Last Vitals:  Vitals Value Taken Time  BP 132/68 07/04/18 1218  Temp 37.1 C 07/04/18 1219  Pulse 92 07/04/18 1229  Resp 7 07/04/18 1229  SpO2 98 % 07/04/18 1229  Vitals shown include unvalidated device data.  Last Pain:  Vitals:   07/04/18 1219  TempSrc:   PainSc: 8       Patients Stated Pain Goal: 2 (78/58/85 0277)  Complications: No apparent anesthesia complications

## 2018-07-04 NOTE — Anesthesia Postprocedure Evaluation (Signed)
Anesthesia Post Note  Patient: Spencer Buckley  Procedure(s) Performed: Revision L5-S1 right gill decompression (Right Back)     Patient location during evaluation: PACU Anesthesia Type: General Level of consciousness: sedated and patient cooperative Pain management: pain level controlled Vital Signs Assessment: post-procedure vital signs reviewed and stable Respiratory status: spontaneous breathing Cardiovascular status: stable Anesthetic complications: no    Last Vitals:  Vitals:   07/04/18 1335 07/04/18 1401  BP: 128/76 (!) 152/96  Pulse: 84 90  Resp: 12 16  Temp:  36.9 C  SpO2: 95% 97%    Last Pain:  Vitals:   07/04/18 1401  TempSrc: Oral  PainSc:                  [name]

## 2018-07-04 NOTE — Discharge Instructions (Signed)

## 2018-07-04 NOTE — Op Note (Signed)
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  Postoperative diagnosis: Same  Operative procedure: Revision Gill decompression right side L5-S1.  Complete inferior L5 facetectomy with resection of calcified bone spur causing compression of the S1 nerve root  First Assistant: Cleta Alberts, PA  Complications: None  Indications: Spencer Buckley is a very pleasant 53 year old gentleman who several years ago had a previous L5-S1 surgical procedure done and now has recurrent severe S1 radicular pain.  Attempts at conservative management failed to alleviate his symptoms.  Because of the progressive neuropathic pain and dysesthesias, and the failure of conservative management we elected to move forward with surgery.  Imaging studies demonstrated a significant osteophyte from the medial aspect of the L5-S1 facet consistent with a calcified synovial cyst.  This was causing marked compression of the S1 nerve root in the lateral recess.  As a result we elected to proceed with a revision decompression to alleviate the neuropathic leg pain.  All appropriate risks benefits and alternatives to surgery were discussed with the patient and consent was obtained.  Operative report: Patient is brought the operating room placed upon the operating room table.  After successful induction of general anesthesia and endotracheal patient teds SCDs were applied and he was turned prone onto the Wilson frame.  All bony prominences were well-padded and the back was prepped and draped in a standard fashion.  2 previous lateral incisions were marked and then I marked the midline.  I placed 2 needles and took an x-ray for localization of the skin incision.  Once I localized the skin incision centered over the L5-S1 facet complex I infiltrated the site with quarter percent Marcaine.  Midline incision was made I sharply dissected down to the deep fascia.  I then injected the sternal  quarter percent Marcaine with epinephrine into the paraspinal muscles bilaterally to aid in analgesia as well as hemostasis.  The fascia was deeply incised and I stripped the paraspinal muscles to expose the L5 and the S1 spinous process and lamina.  Care was taken as I was dissecting down the right side because this was a study of his previous decompression.  For the most part the posterior bony elements were still intact but there was some scar tissue at the level of the facet complex.  Once I completely exposed the facet complex I then placed a Penfield 4 underneath the leading edge of the L5 lamina and took an intraoperative x-ray to confirm my level.  Once confirmed self-retaining retractors were placed and had excellent visualization of the right posterior lateral aspect of the spine.  Using an osteotome I remove the inferior L5 facet.  I then gently began dissecting through the scar tissue down to the leading edge of the S1 lamina.  Once identified this I released the ligamentum flavum from it and used a 2 mm Kerrison punch to resect the edge of the S1 lamina.  This allowed me to clearly identify the ligamentum flavum.  Using a Penfield 4 I gently dissected through the ligamentum flavum until I could create a plane between the ligamentum flavum and the thecal sac.  Using a 1 mm and 2 mm Kerrison Roger I resected the ligamentum flavum and continued working out laterally.  Having already remove the L5 facet I was able to visualize the large calcified mass coming off of the medial aspect of the facet complex and I was also able to visualize the medial border of the S1 superior  facet.  Using a curved curette I gently began dissecting under the facet.  Care was taken to clearly identify the thecal sac to ensure that I was not risking the iatrogenic durotomy.  Once I created a plane using my 1 and 2 mm Kerrison punch I resected the medial border of the S1 facet.  Once this was completed I was able to then visualize  the S1 nerve root.  I then continued using my 2 mm Kerrison Roger to resect the medial aspect of the S1 facet until I could visualize the S1 pedicle.  There was a large epidural veins which were all coagulated with bipolar cautery  At this point I now had excellent visualization of the nerve root at the level of the pedicle.  This allowed me to begin gently dissecting superiorly creating a plane between the S1 nerve root and the osteophyte.  I continue to gently develop this plane.  There was scar tissue from the previous surgery that required resection.  I was ultimately able to get around the entire osteophyte.  Using neuro patties I created and maintained a plane between the thecal sac/nerve root and the osteophyte.  With the neural elements protected I was now able to use my Kerrison Roger to resect the osteophyte.  Once this was resected I could continue to complete my lateral recess decompression.  At this point once I remove the neuro patties I had excellent visualization of the S1 nerve root.  Based on the preoperative MRI the maximum area of neurocompression was at the midportion of the disc down to the inferior portion of the disc.  I made sure that my decompression spanned from the inferior aspect of the L5 pedicle down to the S1 foramen.  Once I had removed the osteophyte I was able to freely palpate along the lateral recess superiorly medially and inferiorly.  The S1 nerve root was freely mobile and clearly visualized.  I took a final intraoperative x-ray confirming that the decompression spanned the area of maximum neural compression based on the preoperative MRI.  At this point with the Gill decompression complete I irrigated the wound copiously with normal saline.  Using bipolar cautery as well as FloSeal I obtained hemostasis.  After final irrigation I used my nerve hook to palpate one last time to ensure the S1 nerve root remained freely mobile and no longer under compression and that it was well  decompressed into the S1 foramen.  At this point I closed the wound in a layered fashion with interrupted #1 Vicryl suture, running 0 Vicryl suture, interrupted 2-0 Vicryl suture, and a 3-0 Monocryl for the skin.  Steri-Strips and dry dressings were applied.  Patient was ultimately extubated and transferred the PACU without incident.  The end of the case all needle sponge counts were correct.  There were no adverse intraoperative events.

## 2018-07-04 NOTE — H&P (Signed)
Addendum H&P.  Patient continues to have significant back buttock and neuropathic right leg pain.  Patient has positive reproduction of right S1 radicular pain on clinical exam.  Imaging studies demonstrate current lateral recess stenosis at L5-S1 on the right side.  Patient had previous right L5-S1 decompression in the past and now has significant recurrent spinal stenosis causing S1 radicular leg pain.  There is been no change in his clinical exam since his last office visit of 06/29/2018.  Surgical plan is to do a revision decompression to address the S1 lateral recess nerve compression.  I have gone over the surgery as well as the risks and benefits with the patient and all of his questions were encouraged and addressed.

## 2018-07-05 ENCOUNTER — Encounter (HOSPITAL_COMMUNITY): Payer: Self-pay | Admitting: Orthopedic Surgery

## 2018-07-05 DIAGNOSIS — M4807 Spinal stenosis, lumbosacral region: Secondary | ICD-10-CM | POA: Diagnosis not present

## 2018-07-05 MED ORDER — OXYCODONE HCL 5 MG PO TABS
5.0000 mg | ORAL_TABLET | ORAL | Status: DC | PRN
  Administered 2018-07-05 (×2): 10 mg via ORAL
  Filled 2018-07-05 (×2): qty 2

## 2018-07-05 MED ORDER — MAGNESIUM CITRATE PO SOLN
1.0000 | Freq: Once | ORAL | Status: AC
  Administered 2018-07-05: 13:00:00 1 via ORAL
  Filled 2018-07-05: qty 296

## 2018-07-05 MED FILL — Thrombin (Recombinant) For Soln 20000 Unit: CUTANEOUS | Qty: 1 | Status: AC

## 2018-07-05 NOTE — Progress Notes (Signed)
Subjective: 1 Day Post-Op Procedure(s) (LRB): Revision L5-S1 right gill decompression (Right) Patient reports pain as moderate.  Radicular leg pain has significantly improved, still complains of dysesthesias and weakness of right leg worse with walking. Also complaining of mild incisional type pain. Tolerating PO with one episode of nausea. No vomiting. Patient has been ambulating down the hall using the side rail for support.  +Void, +Flatus, -BM Denies HA, dizziness, SOB, CP, calf pain, sweats/chills   Objective: Vital signs in last 24 hours: Temp:  [98.3 F (36.8 C)-98.9 F (37.2 C)] 98.9 F (37.2 C) (07/02 0752) Pulse Rate:  [78-94] 84 (07/02 0752) Resp:  [7-20] 18 (07/02 0752) BP: (128-180)/(58-96) 155/84 (07/02 0752) SpO2:  [95 %-100 %] 96 % (07/02 0752) Weight:  [118.1 kg] 118.1 kg (07/01 1400)  Intake/Output from previous day: 07/01 0701 - 07/02 0700 In: 1360 [P.O.:360; I.V.:1000] Out: 50 [Blood:50] Intake/Output this shift: No intake/output data recorded.  No results for input(s): HGB in the last 72 hours. No results for input(s): WBC, RBC, HCT, PLT in the last 72 hours. No results for input(s): NA, K, CL, CO2, BUN, CREATININE, GLUCOSE, CALCIUM in the last 72 hours. No results for input(s): LABPT, INR in the last 72 hours.  Neurologically intact ABD soft Neurovascular intact Sensation intact distally Intact pulses distally Dorsiflexion/Plantar flexion intact Incision: Scant dried blood on honey comb dressing. No active bleeding. No signs of infection Gastrocs: soft, non-tender, no palpable cords. - Homan's Bilaterally.   Assessment/Plan: 1 Day Post-Op Procedure(s) (LRB): Revision L5-S1 right gill decompression (Right)  - DVT PPx: Teds, SCD's, ambulation - Encourage IS - Up with PT/OT: Spoke to OT, patient will likely need a walker for safe ambulation. Still waiting on [employer] to bring LSO brace - Pain management: Change oxycodone 5/325 to 10/325, add  gabapentin. Will send patient home with 5 day supply of Oxycodone/Acetaminophen 10/325, robaxin, and zofran. Will call in 2 weeks of Gabapentin to pharmacy. Patient's pain management provider will manage narcotic medications after 5 day period. - Plan to D/C home today if cleared by PT.    Yvonne Kendall Ward 07/05/2018, 7:54 AM

## 2018-07-05 NOTE — Evaluation (Signed)
Physical Therapy Evaluation Patient Details Name: Spencer Buckley MRN: [id number] DOB: 1965/11/27 Today's Date: 07/05/2018   History of Present Illness  53 yo male s/p L5-S1 R gill decompression 07/04/18 PMH lumbar discectomy 2004, CP, HTN, SOB, vertigo, Hep A,  Clinical Impression  Patient evaluated by Physical Therapy with no further acute PT needs identified. All education has been completed and the patient has no further questions. PTA independent from home with wife. Today, ambulating unit and stairwell without physical assistance.  See below for any follow-up Physical Therapy or equipment needs. PT is signing off. Thank you for this referral.     Follow Up Recommendations Follow surgeon's recommendation for DC plan and follow-up therapies    Equipment Recommendations    RW   Recommendations for Other Services       Precautions / Restrictions Precautions Precautions: Back Precaution Comments: back hand out provided  Required Braces or Orthoses: Spinal Brace Restrictions Weight Bearing Restrictions: No      Mobility  Bed Mobility               General bed mobility comments: OOB on arrival  Transfers Overall transfer level: Needs assistance   Transfers: Sit to/from Stand Sit to Stand: Supervision         General transfer comment: heavy use of BIL UE   Ambulation/Gait Ambulation/Gait assistance: Supervision Gait Distance (Feet): 50 Feet Assistive device: Rolling walker (2 wheeled) Gait Pattern/deviations: Step-to pattern Gait velocity: decreased   General Gait Details:  RLE pain with short step length ,voerall no LOB with RW  Stairs Stairs: Yes Stairs assistance: Supervision Stair Management: One rail Left;Step to pattern;Sideways Number of Stairs: 16 General stair comments: full flight up and down without issue, cues for side stepping and foot placement, sequencing etc. Pt has several stories at home to climb with landings between, discussed importance  of waiting at each landing before attempting each flight due to pain   Wheelchair Mobility    Modified Rankin (Stroke Patients Only)       Balance Overall balance assessment: Mild deficits observed, not formally tested                                           Pertinent Vitals/Pain Pain Assessment: Faces Faces Pain Scale: Hurts even more Pain Location: R LE Pain Descriptors / Indicators: Operative site guarding    Home Living Family/patient expects to be discharged to:: Private residence Living Arrangements: Spouse/significant other Available Help at Discharge: Family;Available 24 hours/day Type of Home: Apartment Home Access: Stairs to enter   Entrance Stairs-Number of Steps: 16 Home Layout: One level Home Equipment: None Additional Comments: wife works from home at this time    Prior Function Level of Independence: Independent               Hand Dominance   Dominant Hand: Right    Extremity/Trunk Assessment   Upper Extremity Assessment Upper Extremity Assessment: Overall WFL for tasks assessed    Lower Extremity Assessment Lower Extremity Assessment: RLE deficits/detail RLE Deficits / Details: R LE pain with movement and laying per patient report    Cervical / Trunk Assessment Cervical / Trunk Assessment: Other exceptions(s/p surg)  Communication   Communication: No difficulties  Cognition Arousal/Alertness: Awake/alert Behavior During Therapy: WFL for tasks assessed/performed Overall Cognitive Status: Within Functional Limits for tasks assessed  General Comments      Exercises     Assessment/Plan    PT Assessment Patent does not need any further PT services  PT Problem List         PT Treatment Interventions      PT Goals (Current goals can be found in the Care Plan section)  Acute Rehab PT Goals Patient Stated Goal: to have R leg pain stop PT Goal  Formulation: With patient    Frequency     Barriers to discharge        Co-evaluation               AM-PAC PT "6 Clicks" Mobility  Outcome Measure Help needed turning from your back to your side while in a flat bed without using bedrails?: None Help needed moving from lying on your back to sitting on the side of a flat bed without using bedrails?: None Help needed moving to and from a bed to a chair (including a wheelchair)?: None Help needed standing up from a chair using your arms (e.g., wheelchair or bedside chair)?: A Little Help needed to walk in hospital room?: A Little Help needed climbing 3-5 steps with a railing? : A Little 6 Click Score: 21    End of Session Equipment Utilized During Treatment: Gait belt Activity Tolerance: Patient tolerated treatment well Patient left: in bed   PT Visit Diagnosis: Unsteadiness on feet (R26.81)    Time: [id number] PT Time Calculation (min) (ACUTE ONLY): 25 min   Charges:   PT Evaluation $PT Eval Low Complexity: 1 Low PT Treatments $Gait Training: 8-22 mins       Etta GrandchildSean Lidia Clavijo, PT, DPT Acute Rehabilitation Services Pager: [phone] Office: [phone]     Etta GrandchildSean Kollen Armenti 07/05/2018, 10:35 AM

## 2018-07-05 NOTE — Progress Notes (Signed)
Patient is discharged from room 3C07 at this time. Alert and in stable condition. IV site d/c'd and instructions read to patient with understanding verbalized. Left unit via wheelchair with all belongings at side. 

## 2018-07-05 NOTE — Evaluation (Signed)
Occupational Therapy Evaluation Patient Details Name: Spencer Buckley MRN: [id number] DOB: 07/28/1965 Today's Date: 07/05/2018    History of Present Illness 53 yo male s/p L5-S1 R gill decompression 07/04/18 PMH lumbar discectomy 2004, CP, HTN, SOB, vertigo, Hep A,   Clinical Impression   Patient is s/p L5- S1 decompressionsurgery resulting in functional limitations due to the deficits listed below (see OT problem list). Pt expressed plans to purchase AE from [facility] upon discharge for adls. Pt could benefit form RW this session. Pt has multiple steps to enter the condo and advised on possible use of a chair on the landing to rest between flights. Patient will benefit from skilled OT acutely to increase independence and safety with ADLS to allow discharge home.    Follow Up Recommendations  No OT follow up    Equipment Recommendations  Other (comment)(RW)    Recommendations for Other Services       Precautions / Restrictions Precautions Precautions: Back Precaution Comments: back hand out provided  Required Braces or Orthoses: Spinal Brace Restrictions Weight Bearing Restrictions: No      Mobility Bed Mobility               General bed mobility comments: OOB on arrival  Transfers Overall transfer level: Needs assistance   Transfers: Sit to/from Stand Sit to Stand: Supervision         General transfer comment: heavy use of BIL UE     Balance Overall balance assessment: Mild deficits observed, not formally tested                                         ADL either performed or assessed with clinical judgement   ADL Overall ADL's : Needs assistance/impaired Eating/Feeding: Independent   Grooming: Independent   Upper Body Bathing: Independent   Lower Body Bathing: Minimal assistance Lower Body Bathing Details (indicate cue type and reason): will need (A) of long handle sponge Upper Body Dressing : Independent   Lower Body Dressing: Minimal  assistance Lower Body Dressing Details (indicate cue type and reason): eduated on reacher and plans to purchase from [employer]: Supervision/safety     Toileting - Clothing Manipulation Details (indicate cue type and reason): educated on toilet aide and demonstration. plans to purchase off [employer] Details (indicate cue type and reason): educated on sequence with R LE being his weak leg Functional mobility during ADLs: Minimal assistance(hand held (A)) General ADL Comments: Pt reports wife working from home and can (A) with all care needed. pt has a exercise machine at home and asking if he could use machine. pt advised to ask at followup appointment and that that level activity should not begin until after 4 weeks of recovery and Doctor advising.      Vision Baseline Vision/History: Wears glasses       Perception     Praxis      Pertinent Vitals/Pain Pain Assessment: Faces Faces Pain Scale: Hurts even more Pain Location: R LE Pain Descriptors / Indicators: Operative site guarding Pain Intervention(s): Monitored during session;Premedicated before session;Repositioned     Hand Dominance Right   Extremity/Trunk Assessment Upper Extremity Assessment Upper Extremity Assessment: Overall WFL for tasks assessed   Lower Extremity Assessment Lower Extremity Assessment: RLE deficits/detail;Defer to PT evaluation RLE Deficits / Details: R LE pain with movement and laying per patient report   Cervical /  Trunk Assessment Cervical / Trunk Assessment: Other exceptions(s/p surg)   Communication Communication Communication: No difficulties   Cognition Arousal/Alertness: Awake/alert Behavior During Therapy: WFL for tasks assessed/performed Overall Cognitive Status: Within Functional Limits for tasks assessed                                     General Comments  dressing dry and intact    Exercises     Shoulder Instructions       Home Living Family/patient expects to be discharged to:: Private residence Living Arrangements: Spouse/significant other Available Help at Discharge: Family;Available 24 hours/day Type of Home: Apartment Home Access: Stairs to enter Entrance Stairs-Number of Steps: 16   Home Layout: One level     Bathroom Shower/Tub: Producer, television/film/videoWalk-in shower   Bathroom Toilet: Handicapped height     Home Equipment: None   Additional Comments: wife works from home at this time      Prior Functioning/Environment Level of Independence: Independent                 OT Problem List:        OT Treatment/Interventions:      OT Goals(Current goals can be found in the care plan section) Acute Rehab OT Goals Patient Stated Goal: to have R leg pain stop Potential to Achieve Goals: Good  OT Frequency:     Barriers to D/C:            Co-evaluation              AM-PAC OT "6 Clicks" Daily Activity     Outcome Measure Help from another person eating meals?: None Help from another person taking care of personal grooming?: None Help from another person toileting, which includes using toliet, bedpan, or urinal?: None Help from another person bathing (including washing, rinsing, drying)?: A Little Help from another person to put on and taking off regular upper body clothing?: None Help from another person to put on and taking off regular lower body clothing?: A Little 6 Click Score: 22   End of Session Nurse Communication: Mobility status;Precautions  Activity Tolerance: Patient tolerated treatment well Patient left: in chair;with call bell/phone within reach;with nursing/sitter in room  OT Visit Diagnosis: Unsteadiness on feet (R26.81)                Time: [id number]) OT Time Calculation (min): 19 min Charges:  OT General Charges $OT Visit: 1 Visit OT Evaluation $OT Eval Moderate Complexity: 1 Mod   Mateo FlowBrynn Avel Ogawa, OTR/L  Acute Rehabilitation Services Pager:  [phone] Office: [phone] .   Mateo FlowBrynn Burgandy Hackworth 07/05/2018, 9:00 AM

## 2018-07-09 NOTE — Discharge Summary (Signed)
Patient ID: Spencer Buckley MRN: [id number] DOB/AGE: 55/04/1965 55 y.o.  Admit date: 07/04/2018 Discharge date: 07/09/2018  Admission Diagnoses:  Active Problems:   Spinal stenosis   Discharge Diagnoses:  Active Problems:   Spinal stenosis  status post Procedure(s): Revision L5-S1 right gill decompression  Past Medical History:  Diagnosis Date  . Anxiety   . Arthritis    of facet joint of lumbar spine  . Chest pain   . Chronic headaches   . Dysphagia   . ED (erectile dysfunction)   . Environmental and seasonal allergies   . Hepatitis    Hep A in 80's  . Hypercholesterolemia   . Hypertension   . Myalgia   . SOB (shortness of breath)   . Spinal stenosis   . Vertigo   . Vitamin D deficiency   . Wears glasses     Surgeries: Procedure(s): Revision L5-S1 right gill decompression on 07/04/2018   Consultants: NOne  Discharged Condition: Improved  Hospital Course: Spencer Buckley is an 53 y.o. male who was admitted 07/04/2018 for operative treatment of Right facet arthrosis with lateral recess stenosis. Patient failed conservative treatments (please see the history and physical for the specifics) and had severe unremitting pain that affects sleep, daily activities and work/hobbies. After pre-op clearance, the patient was taken to the operating room on 07/04/2018 and underwent  Procedure(s): Revision L5-S1 right gill decompression.    Patient was given perioperative antibiotics:  Anti-infectives (From admission, onward)   Start     Dose/Rate Route Frequency Ordered Stop   07/04/18 1700  ceFAZolin (ANCEF) IVPB 2g/100 mL premix     2 g 200 mL/hr over 30 Minutes Intravenous Every 8 hours 07/04/18 1356 07/05/18 0725   07/04/18 0600  ceFAZolin (ANCEF) 3 g in dextrose 5 % 50 mL IVPB     3 g 100 mL/hr over 30 Minutes Intravenous To Surgery 07/03/18 0630 07/04/18 1421       Patient was given sequential compression devices and early ambulation to prevent DVT.   Patient benefited  maximally from hospital stay and there were no complications. At the time of discharge, the patient was urinating/moving their bowels without difficulty, tolerating a regular diet, pain is controlled with oral pain medications and they have been cleared by PT/OT.   Recent vital signs: No data found.   Recent laboratory studies: No results for input(s): WBC, HGB, HCT, PLT, NA, K, CL, CO2, BUN, CREATININE, GLUCOSE, INR, CALCIUM in the last 72 hours.  Invalid input(s): PT, 2   Discharge Medications:   Allergies as of 07/05/2018   No Known Allergies     Medication List    STOP taking these medications   aspirin EC 81 MG tablet   baclofen 10 MG tablet Commonly known as: LIORESAL   naproxen 500 MG tablet Commonly known as: NAPROSYN   Oxycodone HCl 10 MG Tabs   sildenafil 100 MG tablet Commonly known as: VIAGRA   Vitamin D (Ergocalciferol) 1.25 MG (50000 UT) Caps capsule Commonly known as: DRISDOL     TAKE these medications   albuterol 108 (90 Base) MCG/ACT inhaler Commonly known as: ProAir HFA Inhale 2 puffs into the lungs every 6 (six) hours as needed for wheezing or shortness of breath.   atorvastatin 20 MG tablet Commonly known as: LIPITOR Take 20 mg by mouth daily.   fluticasone 50 MCG/ACT nasal spray Commonly known as: FLONASE Place 1 spray into both nostrils 2 (two) times daily as needed for allergies or rhinitis.  meclizine 25 MG tablet Commonly known as: ANTIVERT Take 25 mg by mouth 3 (three) times daily as needed for dizziness.   methocarbamol 500 MG tablet Commonly known as: Robaxin Take 1 tablet (500 mg total) by mouth every 8 (eight) hours as needed for up to 5 days for muscle spasms.   ondansetron 4 MG tablet Commonly known as: Zofran Take 1 tablet (4 mg total) by mouth every 8 (eight) hours as needed for nausea or vomiting.   oxyCODONE-acetaminophen 10-325 MG tablet Commonly known as: Percocet Take 1 tablet by mouth every 6 (six) hours as needed for  up to 5 days for pain.   valsartan-hydrochlorothiazide 320-25 MG tablet Commonly known as: DIOVAN-HCT Take 1 tablet by mouth daily.       Diagnostic Studies: Dg Chest 2 View  Result Date: 06/28/2018 CLINICAL DATA:  Preoperative for lumbar surgery.  Hypertension. EXAM: CHEST - 2 VIEW COMPARISON:  January 09, 2016 FINDINGS: There is no edema or consolidation. The heart size and pulmonary vascularity are normal. No adenopathy. No bone lesions. IMPRESSION: No edema or consolidation. Electronically Signed   By: Lowella Grip III M.D.   On: 06/28/2018 15:02   Dg Lumbar Spine 2-3 Views  Result Date: 07/04/2018 CLINICAL DATA:  Lumbar surgery L5-S1 decompression EXAM: LUMBAR SPINE - 2-3 VIEW COMPARISON:  Portable exam is intraoperatively compared to 12/29/2015 FINDINGS: 5 lumbar vertebra present on prior study and labeled on prior MR. Exam at 0858 hours demonstrates metallic probes dorsal to the mid L4 and mid L5 levels. Exam at 0916 hours demonstrates a curved metallic probe dorsal to the L5-S1 disc space level. IMPRESSION: Intraoperative lumbar localization images as above. Electronically Signed   By: Lavonia Dana M.D.   On: 07/04/2018 09:31   Dg Lumbar Spine 1 View  Result Date: 07/04/2018 CLINICAL DATA:  Lumbar surgery. EXAM: LUMBAR SPINE - 1 VIEW COMPARISON:  Radiographs of same day. FINDINGS: Single intraoperative cross-table lateral projection of the lumbar spine demonstrates surgical probes at the L5-S1 level. IMPRESSION: Surgical localization as described above. Electronically Signed   By: Marijo Conception M.D.   On: 07/04/2018 11:46    Discharge Instructions    Incentive spirometry RT   Complete by: As directed       Follow-up Information    Melina Schools, MD. Schedule an appointment as soon as possible for a visit in 2 weeks.   Specialty: Orthopedic Surgery Why: For suture removal, If symptoms worsen, For wound re-check Contact information: [address] [place] [address] [phone]           Discharge Plan:  discharge to home  Disposition: stable    Signed: Yvonne Kendall Lennin Osmond for [facility] PA-C [facility] [phone] 07/09/2018, 11:26 AM

## 2019-03-05 DIAGNOSIS — Z79899 Other long term (current) drug therapy: Secondary | ICD-10-CM | POA: Diagnosis not present

## 2019-03-05 DIAGNOSIS — G894 Chronic pain syndrome: Secondary | ICD-10-CM | POA: Diagnosis not present

## 2019-03-05 DIAGNOSIS — M545 Low back pain: Secondary | ICD-10-CM | POA: Diagnosis not present

## 2019-03-05 DIAGNOSIS — G8929 Other chronic pain: Secondary | ICD-10-CM | POA: Diagnosis not present

## 2019-04-04 DIAGNOSIS — G894 Chronic pain syndrome: Secondary | ICD-10-CM | POA: Diagnosis not present

## 2019-04-04 DIAGNOSIS — G8929 Other chronic pain: Secondary | ICD-10-CM | POA: Diagnosis not present

## 2019-04-04 DIAGNOSIS — Z79899 Other long term (current) drug therapy: Secondary | ICD-10-CM | POA: Diagnosis not present

## 2019-04-04 DIAGNOSIS — M545 Low back pain: Secondary | ICD-10-CM | POA: Diagnosis not present

## 2019-04-10 DIAGNOSIS — M10071 Idiopathic gout, right ankle and foot: Secondary | ICD-10-CM | POA: Diagnosis not present

## 2019-05-06 DIAGNOSIS — Z79891 Long term (current) use of opiate analgesic: Secondary | ICD-10-CM | POA: Diagnosis not present

## 2019-05-06 DIAGNOSIS — G894 Chronic pain syndrome: Secondary | ICD-10-CM | POA: Diagnosis not present

## 2019-05-06 DIAGNOSIS — R030 Elevated blood-pressure reading, without diagnosis of hypertension: Secondary | ICD-10-CM | POA: Diagnosis not present

## 2019-05-06 DIAGNOSIS — Z791 Long term (current) use of non-steroidal anti-inflammatories (NSAID): Secondary | ICD-10-CM | POA: Diagnosis not present

## 2019-05-06 DIAGNOSIS — M545 Low back pain: Secondary | ICD-10-CM | POA: Diagnosis not present

## 2019-05-06 DIAGNOSIS — M1990 Unspecified osteoarthritis, unspecified site: Secondary | ICD-10-CM | POA: Diagnosis not present

## 2019-05-06 DIAGNOSIS — Z6836 Body mass index (BMI) 36.0-36.9, adult: Secondary | ICD-10-CM | POA: Diagnosis not present

## 2019-05-06 DIAGNOSIS — G629 Polyneuropathy, unspecified: Secondary | ICD-10-CM | POA: Diagnosis not present

## 2019-05-06 DIAGNOSIS — Z79899 Other long term (current) drug therapy: Secondary | ICD-10-CM | POA: Diagnosis not present

## 2019-05-06 DIAGNOSIS — G8929 Other chronic pain: Secondary | ICD-10-CM | POA: Diagnosis not present

## 2019-05-19 DIAGNOSIS — M19071 Primary osteoarthritis, right ankle and foot: Secondary | ICD-10-CM | POA: Diagnosis not present

## 2019-06-05 DIAGNOSIS — K0889 Other specified disorders of teeth and supporting structures: Secondary | ICD-10-CM | POA: Diagnosis not present

## 2019-06-05 DIAGNOSIS — N529 Male erectile dysfunction, unspecified: Secondary | ICD-10-CM | POA: Diagnosis not present

## 2019-06-05 DIAGNOSIS — M109 Gout, unspecified: Secondary | ICD-10-CM | POA: Diagnosis not present

## 2019-06-06 DIAGNOSIS — Z79899 Other long term (current) drug therapy: Secondary | ICD-10-CM | POA: Diagnosis not present

## 2019-06-06 DIAGNOSIS — G894 Chronic pain syndrome: Secondary | ICD-10-CM | POA: Diagnosis not present

## 2019-06-06 DIAGNOSIS — M545 Low back pain: Secondary | ICD-10-CM | POA: Diagnosis not present

## 2019-06-06 DIAGNOSIS — G8929 Other chronic pain: Secondary | ICD-10-CM | POA: Diagnosis not present

## 2019-06-16 IMAGING — DX DG FOOT 2V*L*
2 series · 2 of 2 positions shown · non-contrast
Comparison: None.

CLINICAL DATA: Status post fall with left foot pain.

EXAM:
LEFT FOOT - 2 VIEW

[foot ap]
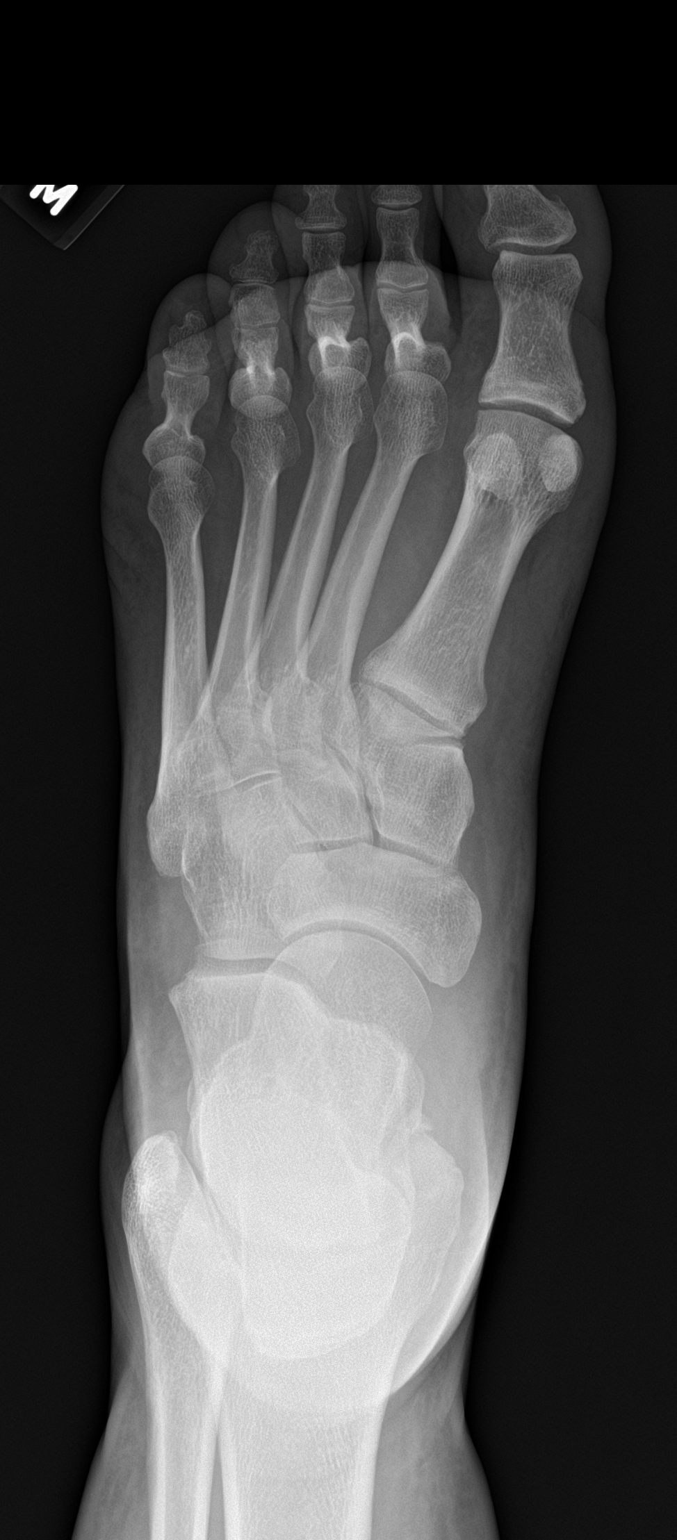

[foot lat]
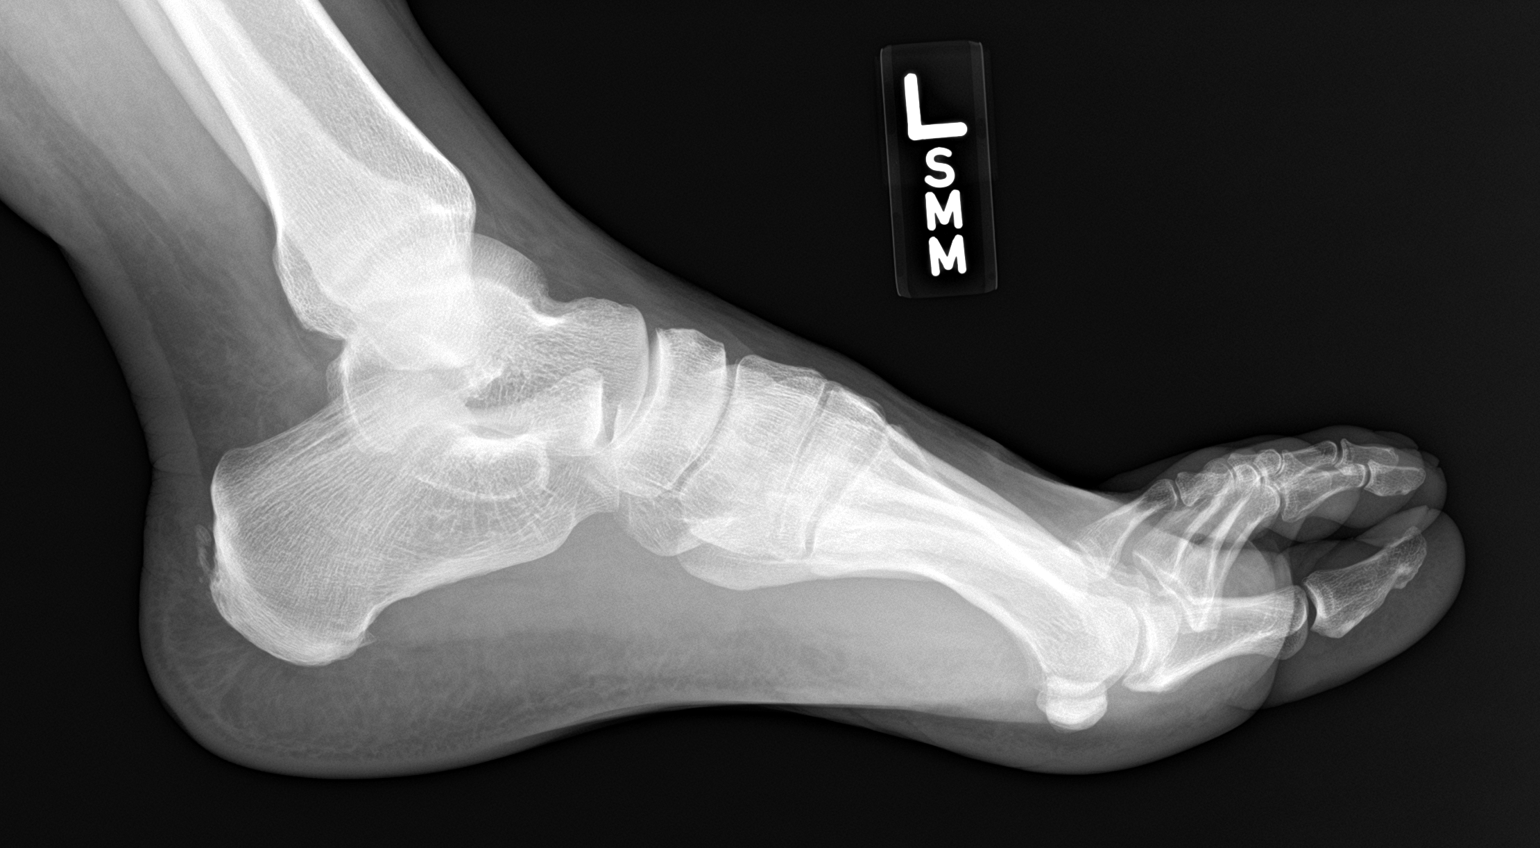

[2 of 2 positions shown; findings below may reference images not displayed]

FINDINGS: There is no evidence of fracture or dislocation. There is no
evidence of arthropathy or other focal bone abnormality. Soft
tissues are unremarkable.
IMPRESSION: Negative.

## 2019-06-25 DIAGNOSIS — J019 Acute sinusitis, unspecified: Secondary | ICD-10-CM | POA: Diagnosis not present

## 2019-06-25 DIAGNOSIS — K0889 Other specified disorders of teeth and supporting structures: Secondary | ICD-10-CM | POA: Diagnosis not present

## 2019-07-04 DIAGNOSIS — G894 Chronic pain syndrome: Secondary | ICD-10-CM | POA: Diagnosis not present

## 2019-07-04 DIAGNOSIS — M545 Low back pain: Secondary | ICD-10-CM | POA: Diagnosis not present

## 2019-07-04 DIAGNOSIS — Z79899 Other long term (current) drug therapy: Secondary | ICD-10-CM | POA: Diagnosis not present

## 2019-07-04 DIAGNOSIS — G8929 Other chronic pain: Secondary | ICD-10-CM | POA: Diagnosis not present

## 2019-07-22 DIAGNOSIS — M238X2 Other internal derangements of left knee: Secondary | ICD-10-CM | POA: Diagnosis not present

## 2019-07-22 DIAGNOSIS — M25561 Pain in right knee: Secondary | ICD-10-CM | POA: Diagnosis not present

## 2019-07-22 DIAGNOSIS — M2392 Unspecified internal derangement of left knee: Secondary | ICD-10-CM | POA: Diagnosis not present

## 2019-07-22 DIAGNOSIS — M25562 Pain in left knee: Secondary | ICD-10-CM | POA: Diagnosis not present

## 2019-07-23 DIAGNOSIS — M545 Low back pain: Secondary | ICD-10-CM | POA: Diagnosis not present

## 2019-07-23 DIAGNOSIS — M5416 Radiculopathy, lumbar region: Secondary | ICD-10-CM | POA: Diagnosis not present

## 2019-07-23 DIAGNOSIS — Z9089 Acquired absence of other organs: Secondary | ICD-10-CM | POA: Diagnosis not present

## 2019-08-05 DIAGNOSIS — G8929 Other chronic pain: Secondary | ICD-10-CM | POA: Diagnosis not present

## 2019-08-05 DIAGNOSIS — M25562 Pain in left knee: Secondary | ICD-10-CM | POA: Diagnosis not present

## 2019-08-05 DIAGNOSIS — Z79899 Other long term (current) drug therapy: Secondary | ICD-10-CM | POA: Diagnosis not present

## 2019-08-05 DIAGNOSIS — G894 Chronic pain syndrome: Secondary | ICD-10-CM | POA: Diagnosis not present

## 2019-08-05 DIAGNOSIS — M545 Low back pain: Secondary | ICD-10-CM | POA: Diagnosis not present

## 2019-08-05 DIAGNOSIS — M5416 Radiculopathy, lumbar region: Secondary | ICD-10-CM | POA: Diagnosis not present

## 2019-08-09 DIAGNOSIS — M25562 Pain in left knee: Secondary | ICD-10-CM | POA: Diagnosis not present

## 2019-08-09 DIAGNOSIS — S83242A Other tear of medial meniscus, current injury, left knee, initial encounter: Secondary | ICD-10-CM | POA: Diagnosis not present

## 2019-08-13 DIAGNOSIS — M4800 Spinal stenosis, site unspecified: Secondary | ICD-10-CM | POA: Diagnosis not present

## 2019-09-04 DIAGNOSIS — G8929 Other chronic pain: Secondary | ICD-10-CM | POA: Diagnosis not present

## 2019-09-04 DIAGNOSIS — Z79899 Other long term (current) drug therapy: Secondary | ICD-10-CM | POA: Diagnosis not present

## 2019-09-04 DIAGNOSIS — G894 Chronic pain syndrome: Secondary | ICD-10-CM | POA: Diagnosis not present

## 2019-09-04 DIAGNOSIS — M545 Low back pain: Secondary | ICD-10-CM | POA: Diagnosis not present

## 2019-09-17 DIAGNOSIS — M5136 Other intervertebral disc degeneration, lumbar region: Secondary | ICD-10-CM | POA: Diagnosis not present

## 2019-11-19 ENCOUNTER — Other Ambulatory Visit: Payer: Self-pay

## 2019-11-19 ENCOUNTER — Other Ambulatory Visit (HOSPITAL_COMMUNITY): Payer: Self-pay | Admitting: Physician Assistant

## 2019-11-19 ENCOUNTER — Ambulatory Visit (HOSPITAL_COMMUNITY)
Admission: RE | Admit: 2019-11-19 | Discharge: 2019-11-19 | Disposition: A | Discharge status: Home / Self Care | Payer: Medicare HMO | Source: Ambulatory Visit | Attending: Physician Assistant | Admitting: Physician Assistant

## 2019-11-19 DIAGNOSIS — M79662 Pain in left lower leg: Secondary | ICD-10-CM

## 2019-11-19 DIAGNOSIS — R600 Localized edema: Secondary | ICD-10-CM | POA: Diagnosis present

## 2020-07-28 DIAGNOSIS — R079 Chest pain, unspecified: Secondary | ICD-10-CM | POA: Diagnosis not present

## 2020-07-29 ENCOUNTER — Ambulatory Visit (HOSPITAL_COMMUNITY)
Admission: RE | Admit: 2020-07-29 | Discharge: 2020-07-29 | Disposition: A | Discharge status: Home / Self Care | Payer: Medicare HMO | Source: Other Acute Inpatient Hospital | Attending: Cardiovascular Disease | Admitting: Cardiovascular Disease

## 2020-07-29 ENCOUNTER — Other Ambulatory Visit: Payer: Self-pay | Admitting: Cardiology

## 2020-07-29 ENCOUNTER — Encounter (HOSPITAL_COMMUNITY)
Admission: RE | Disposition: A | Discharge status: Home / Self Care | Payer: Self-pay | Source: Other Acute Inpatient Hospital | Attending: Cardiovascular Disease

## 2020-07-29 DIAGNOSIS — M109 Gout, unspecified: Secondary | ICD-10-CM

## 2020-07-29 DIAGNOSIS — R079 Chest pain, unspecified: Secondary | ICD-10-CM

## 2020-07-29 DIAGNOSIS — Z7982 Long term (current) use of aspirin: Secondary | ICD-10-CM | POA: Insufficient documentation

## 2020-07-29 DIAGNOSIS — I249 Acute ischemic heart disease, unspecified: Secondary | ICD-10-CM | POA: Diagnosis not present

## 2020-07-29 DIAGNOSIS — R9431 Abnormal electrocardiogram [ECG] [EKG]: Secondary | ICD-10-CM

## 2020-07-29 DIAGNOSIS — I10 Essential (primary) hypertension: Secondary | ICD-10-CM

## 2020-07-29 DIAGNOSIS — I2510 Atherosclerotic heart disease of native coronary artery without angina pectoris: Secondary | ICD-10-CM | POA: Insufficient documentation

## 2020-07-29 DIAGNOSIS — E785 Hyperlipidemia, unspecified: Secondary | ICD-10-CM

## 2020-07-29 DIAGNOSIS — Z79899 Other long term (current) drug therapy: Secondary | ICD-10-CM | POA: Diagnosis not present

## 2020-07-29 DIAGNOSIS — I517 Cardiomegaly: Secondary | ICD-10-CM

## 2020-07-29 DIAGNOSIS — I119 Hypertensive heart disease without heart failure: Secondary | ICD-10-CM

## 2020-07-29 HISTORY — PX: LEFT HEART CATH AND CORONARY ANGIOGRAPHY: CATH118249

## 2020-07-29 SURGERY — LEFT HEART CATH AND CORONARY ANGIOGRAPHY
Anesthesia: LOCAL

## 2020-07-29 MED ORDER — SODIUM CHLORIDE 0.9% FLUSH: 3.0000 mL | Freq: Two times a day (BID) | INTRAVENOUS | Status: DC

## 2020-07-29 MED ORDER — SODIUM CHLORIDE 0.9% FLUSH: 3.0000 mL | INTRAVENOUS | Status: DC | PRN

## 2020-07-29 MED ORDER — FUROSEMIDE 20 MG PO TABS: 20.0000 mg | ORAL_TABLET | Freq: Every day | ORAL | 0 refills | Status: DC

## 2020-07-29 MED ORDER — ONDANSETRON HCL 4 MG/2ML IJ SOLN: 4.0000 mg | Freq: Four times a day (QID) | INTRAMUSCULAR | Status: DC | PRN

## 2020-07-29 MED ORDER — HEPARIN SODIUM (PORCINE) 1000 UNIT/ML IJ SOLN
INTRAMUSCULAR | Status: DC | PRN
  Administered 2020-07-29: 6000 [IU] via INTRAVENOUS

## 2020-07-29 MED ORDER — VERAPAMIL HCL 2.5 MG/ML IV SOLN
INTRAVENOUS | Status: AC
  Filled 2020-07-29: qty 2

## 2020-07-29 MED ORDER — LIDOCAINE HCL (PF) 1 % IJ SOLN
INTRAMUSCULAR | Status: AC
  Filled 2020-07-29: qty 30

## 2020-07-29 MED ORDER — LIDOCAINE HCL (PF) 1 % IJ SOLN
INTRAMUSCULAR | Status: DC | PRN
  Administered 2020-07-29: 2 mL via SUBCUTANEOUS

## 2020-07-29 MED ORDER — HEPARIN SODIUM (PORCINE) 1000 UNIT/ML IJ SOLN
INTRAMUSCULAR | Status: AC
  Filled 2020-07-29: qty 1

## 2020-07-29 MED ORDER — MIDAZOLAM HCL 2 MG/2ML IJ SOLN
INTRAMUSCULAR | Status: AC
  Filled 2020-07-29: qty 2

## 2020-07-29 MED ORDER — SODIUM CHLORIDE 0.9 % IV SOLN: INTRAVENOUS | Status: AC

## 2020-07-29 MED ORDER — MIDAZOLAM HCL 2 MG/2ML IJ SOLN
INTRAMUSCULAR | Status: DC | PRN
  Administered 2020-07-29: 2 mg via INTRAVENOUS

## 2020-07-29 MED ORDER — SODIUM CHLORIDE 0.9 % IV SOLN: 250.0000 mL | INTRAVENOUS | Status: DC | PRN

## 2020-07-29 MED ORDER — COLCHICINE 0.6 MG PO TABS: 0.6000 mg | ORAL_TABLET | Freq: Two times a day (BID) | ORAL | 0 refills | Status: AC

## 2020-07-29 MED ORDER — HEPARIN (PORCINE) IN NACL 1000-0.9 UT/500ML-% IV SOLN
INTRAVENOUS | Status: AC
  Filled 2020-07-29: qty 1000

## 2020-07-29 MED ORDER — ASPIRIN EC 81 MG PO TBEC: 81.0000 mg | DELAYED_RELEASE_TABLET | Freq: Every day | ORAL | 1 refills | Status: AC

## 2020-07-29 MED ORDER — VERAPAMIL HCL 2.5 MG/ML IV SOLN
INTRAVENOUS | Status: DC | PRN
  Administered 2020-07-29: 10 mL via INTRA_ARTERIAL

## 2020-07-29 MED ORDER — HEPARIN (PORCINE) IN NACL 1000-0.9 UT/500ML-% IV SOLN
INTRAVENOUS | Status: DC | PRN
  Administered 2020-07-29 (×3): 500 mL

## 2020-07-29 MED ORDER — HYDRALAZINE HCL 20 MG/ML IJ SOLN: 10.0000 mg | INTRAMUSCULAR | Status: DC | PRN

## 2020-07-29 MED ORDER — ACETAMINOPHEN 325 MG PO TABS: 650.0000 mg | ORAL_TABLET | ORAL | Status: DC | PRN

## 2020-07-29 MED ORDER — LABETALOL HCL 5 MG/ML IV SOLN: 10.0000 mg | INTRAVENOUS | Status: DC | PRN

## 2020-07-29 MED ORDER — FENTANYL CITRATE (PF) 100 MCG/2ML IJ SOLN
INTRAMUSCULAR | Status: DC | PRN
  Administered 2020-07-29: 50 ug via INTRAVENOUS

## 2020-07-29 MED ORDER — FENTANYL CITRATE (PF) 100 MCG/2ML IJ SOLN
INTRAMUSCULAR | Status: AC
  Filled 2020-07-29: qty 2

## 2020-07-29 SURGICAL SUPPLY — 9 items
CATH 5FR JL3.5 JR4 ANG PIG MP (CATHETERS) ×2 IMPLANT
DEVICE RAD COMP TR BAND LRG (VASCULAR PRODUCTS) ×2 IMPLANT
GLIDESHEATH SLEND SS 6F .021 (SHEATH) ×2 IMPLANT
GUIDEWIRE INQWIRE 1.5J.035X260 (WIRE) ×1 IMPLANT
INQWIRE 1.5J .035X260CM (WIRE) ×2
KIT HEART LEFT (KITS) ×2 IMPLANT
PACK CARDIAC CATHETERIZATION (CUSTOM PROCEDURE TRAY) ×2 IMPLANT
TRANSDUCER W/STOPCOCK (MISCELLANEOUS) ×2 IMPLANT
TUBING CIL FLEX 10 FLL-RA (TUBING) ×2 IMPLANT

## 2020-07-29 NOTE — Discharge Summary (Signed)
Discharge Summary    Patient ID: Spencer Buckley MRN: [id number]; DOB: 1965/07/07  Admit date: 07/29/2020 Discharge date: 07/29/2020  PCP:  Wilfrid Lund, PA   [facility] Providers Cardiologist:  Norman Herrlich, MD     Discharge Diagnoses    Principal Problem:   Chest pain of uncertain etiology Active Problems:   Hyperlipidemia   Hypertension   Gout  Diagnostic Studies/Procedures    Cath: 07/29/20    Prox RCA to Mid RCA lesion is 20% stenosed.   Prox Cx to Mid Cx lesion is 20% stenosed.   Mild non-obstructive CAD Mildly elevated LVEDP consistent with hypertensive heart disease.   Recommendations: Would consider long term treatment with ASA and a statin. He may benefit from low dose Lasix given his elevated LVEDP. _____________   History of Present Illness     Spencer Buckley is a 55 y.o. male with PMH of HTN and gout who presented to [facility]. Reports he went to [facility] for right great toe pain and also some substernal chest pain. He was sent to [facility] for further evaluation of the chest pain and admitted. Cardiology was consulted and seen by Dr. Dulce Sellar. Had an echo which showed normal EF with LVH Underwent stress testing which showed no ischemia with hypokinesis in the inferoseptal walls. It was noted he had some transient ST elevation reported on telemetry. As a result to was recommended to transfer to [facility] for further management with cardiac cath for definitive evaluation.   Hospital Course     Underwent cardiac cath noted above with very mild disease of 20% mRCA and mLCx. Recommendations for medical therapy with ongoing risk factor reduction. Will start on 81mg  ASA, along with lasix 20mg  daily per recommendations of MD post cath given mildly elevated LVEDP. Will continue home medications including benicar and atorvastatin. No complications noted post cath. Given post cath instructions/restrictions prior to discharge. Of note he presented with gout flare of the right  great toe with elevated uric acid. Was treated with several doses of colchicine during admission. Will continue on colchicine 0.6mg  BID for additional 5 days at discharge.   Did the patient have an acute coronary syndrome (MI, NSTEMI, STEMI, etc) this admission?:  No                               Did the patient have a percutaneous coronary intervention (stent / angioplasty)?:  No.       _____________  Discharge Vitals Blood pressure (!) 150/92, pulse 74, resp. rate (!) 23, SpO2 98 %.  There were no vitals filed for this visit.  Labs & Radiologic Studies    CBC No results for input(s): WBC, NEUTROABS, HGB, HCT, MCV, PLT in the last 72 hours. Basic Metabolic Panel No results for input(s): NA, K, CL, CO2, GLUCOSE, BUN, CREATININE, CALCIUM, MG, PHOS in the last 72 hours. Liver Function Tests No results for input(s): AST, ALT, ALKPHOS, BILITOT, PROT, ALBUMIN in the last 72 hours. No results for input(s): LIPASE, AMYLASE in the last 72 hours. High Sensitivity Troponin:   No results for input(s): TROPONINIHS in the last 720 hours.  BNP Invalid input(s): POCBNP D-Dimer No results for input(s): DDIMER in the last 72 hours. Hemoglobin A1C No results for input(s): HGBA1C in the last 72 hours. Fasting Lipid Panel No results for input(s): CHOL, HDL, LDLCALC, TRIG, CHOLHDL, LDLDIRECT in the last 72 hours. Thyroid Function Tests No results for input(s): TSH,  T4TOTAL, T3FREE, THYROIDAB in the last 72 hours.  Invalid input(s): FREET3 _____________  CARDIAC CATHETERIZATION  Result Date: 07/29/2020 Formatting of this result is different from the original.   Prox RCA to Mid RCA lesion is 20% stenosed.   Prox Cx to Mid Cx lesion is 20% stenosed. Mild non-obstructive CAD Mildly elevated LVEDP consistent with hypertensive heart disease. Recommendations: Would consider long term treatment with ASA and a statin. He may benefit from low dose Lasix given his elevated LVEDP.   Disposition   Pt is being  discharged home today in good condition.  Follow-up Plans & Appointments     Follow-up Information     Baldo Daub, MD Follow up on 09/02/2020.   Specialties: Cardiology, Radiology Why: at 10am for your follow up appt Contact information: [address] [place] [address] [phone]                  Discharge Medications   Allergies as of 07/29/2020   No Known Allergies      Medication List     TAKE these medications    albuterol 108 (90 Base) MCG/ACT inhaler Commonly known as: ProAir HFA Inhale 2 puffs into the lungs every 6 (six) hours as needed for wheezing or shortness of breath.   aspirin EC 81 MG tablet Take 1 tablet (81 mg total) by mouth daily. Swallow whole.   atorvastatin 20 MG tablet Commonly known as: LIPITOR Take 20 mg by mouth daily.   colchicine 0.6 MG tablet Take 1 tablet (0.6 mg total) by mouth 2 (two) times daily for 5 days.   fluticasone 50 MCG/ACT nasal spray Commonly known as: FLONASE Place 1 spray into both nostrils 2 (two) times daily as needed for allergies or rhinitis.   furosemide 20 MG tablet Commonly known as: Lasix Take 1 tablet (20 mg total) by mouth daily.   meclizine 25 MG tablet Commonly known as: ANTIVERT Take 25 mg by mouth 3 (three) times daily as needed for dizziness.   olmesartan 20 MG tablet Commonly known as: BENICAR Take 20 mg by mouth daily.   ondansetron 4 MG tablet Commonly known as: Zofran Take 1 tablet (4 mg total) by mouth every 8 (eight) hours as needed for nausea or vomiting.          Outstanding Labs/Studies   N/a   Duration of Discharge Encounter   Greater than 30 minutes including physician time.  Signed, Laverda Page, NP 07/29/2020, 4:35 PM

## 2020-07-29 NOTE — H&P (View-Only) (Signed)
Cath Lab Note:   Pt transferred from [facility] for cath. Pt seen at [facility] by my partner Dr. Munley. Full cardiology consult to be scanned into system. Full H and P to follow.  Pt admitted with chest pain. Negative troponin. Worrisome EKG changes.  Plan cath today. Labs reviewed.   Spencer Buckley 07/29/2020 2:32 PM  

## 2020-07-29 NOTE — Interval H&P Note (Signed)
History and Physical Interval Note:  07/29/2020 2:34 PM  Spencer Buckley  has presented today for surgery, with the diagnosis of chest pain.  The various methods of treatment have been discussed with the patient and family. After consideration of risks, benefits and other options for treatment, the patient has consented to  Procedure(s): LEFT HEART CATH AND CORONARY ANGIOGRAPHY (N/A) as a surgical intervention.  The patient's history has been reviewed, patient examined, no change in status, stable for surgery.  I have reviewed the patient's chart and labs.  Questions were answered to the patient's satisfaction.    Cath Lab Visit (complete for each Cath Lab visit)  Clinical Evaluation Leading to the Procedure:   ACS: No.  Non-ACS:    Anginal Classification: CCS III  Anti-ischemic medical therapy: No Therapy  Non-Invasive Test Results: No non-invasive testing performed  Prior CABG: No previous CABG        Verne Carrow

## 2020-07-29 NOTE — Progress Notes (Signed)
Cath Lab Note:   Pt transferred from [facility] for cath. Pt seen at [facility] by my partner Dr. Dulce Sellar. Full cardiology consult to be scanned into system. Full H and P to follow.  Pt admitted with chest pain. Negative troponin. Worrisome EKG changes.  Plan cath today. Labs reviewed.   Spencer Buckley 07/29/2020 2:32 PM

## 2020-07-30 ENCOUNTER — Encounter (HOSPITAL_COMMUNITY): Payer: Self-pay | Admitting: Cardiovascular Disease

## 2020-08-21 ENCOUNTER — Other Ambulatory Visit: Payer: Self-pay | Admitting: Cardiology

## 2020-08-26 DIAGNOSIS — M1990 Unspecified osteoarthritis, unspecified site: Secondary | ICD-10-CM | POA: Insufficient documentation

## 2020-08-26 DIAGNOSIS — M791 Myalgia, unspecified site: Secondary | ICD-10-CM | POA: Insufficient documentation

## 2020-08-26 DIAGNOSIS — N529 Male erectile dysfunction, unspecified: Secondary | ICD-10-CM | POA: Insufficient documentation

## 2020-08-26 DIAGNOSIS — G8929 Other chronic pain: Secondary | ICD-10-CM | POA: Insufficient documentation

## 2020-08-26 DIAGNOSIS — F419 Anxiety disorder, unspecified: Secondary | ICD-10-CM | POA: Insufficient documentation

## 2020-08-26 DIAGNOSIS — K759 Inflammatory liver disease, unspecified: Secondary | ICD-10-CM | POA: Insufficient documentation

## 2020-08-26 DIAGNOSIS — R1310 Dysphagia, unspecified: Secondary | ICD-10-CM | POA: Insufficient documentation

## 2020-08-26 DIAGNOSIS — R519 Headache, unspecified: Secondary | ICD-10-CM | POA: Insufficient documentation

## 2020-08-26 DIAGNOSIS — Z973 Presence of spectacles and contact lenses: Secondary | ICD-10-CM | POA: Insufficient documentation

## 2020-08-26 DIAGNOSIS — R42 Dizziness and giddiness: Secondary | ICD-10-CM | POA: Insufficient documentation

## 2020-08-26 DIAGNOSIS — E559 Vitamin D deficiency, unspecified: Secondary | ICD-10-CM | POA: Insufficient documentation

## 2020-08-26 DIAGNOSIS — J3089 Other allergic rhinitis: Secondary | ICD-10-CM | POA: Insufficient documentation

## 2020-09-02 ENCOUNTER — Ambulatory Visit: Payer: Medicare HMO | Admitting: Cardiology

## 2021-01-24 ENCOUNTER — Other Ambulatory Visit: Payer: Self-pay | Admitting: Cardiology

## 2022-06-03 ENCOUNTER — Encounter (HOSPITAL_COMMUNITY): Payer: Self-pay

## 2022-06-15 NOTE — Patient Instructions (Signed)
SURGICAL WAITING ROOM VISITATION Patients having surgery or a procedure may have no more than 2 support people in the waiting area - these visitors may rotate in the visitor waiting room.   Due to an increase in RSV and influenza rates and associated hospitalizations, children ages 69 and under may not visit patients in [facility] hospitals. If the patient needs to stay at the hospital during part of their recovery, the visitor guidelines for inpatient rooms apply.  PRE-OP VISITATION  Pre-op nurse will coordinate an appropriate time for 1 support person to accompany the patient in pre-op.  This support person may not rotate.  This visitor will be contacted when the time is appropriate for the visitor to come back in the pre-op area.  Please refer to the [facility] website for the visitor guidelines for Inpatients (after your surgery is over and you are in a regular room).  You are not required to quarantine at this time prior to your surgery. However, you must do this: Hand Hygiene often Do NOT share personal items Notify your provider if you are in close contact with someone who has COVID or you develop fever 100.4 or greater, new onset of sneezing, cough, sore throat, shortness of breath or body aches.  If you test positive for Covid or have been in contact with anyone that has tested positive in the last 10 days please notify you surgeon.    Your procedure is scheduled on:  Friday,  July 01, 2022  Report to [facility] Main Entrance: [facility] [facility] entrance where the [employer] is available.   Report to admitting at:  07:00    AM  +++++Call this number if you have any questions or problems the morning of surgery [phone]  Do not eat food after Midnight the night prior to your surgery/procedure.  After Midnight you may have the following liquids until   06:30 AM  DAY OF SURGERY  Clear Liquid Diet Water Black Coffee (sugar ok, NO MILK/CREAM OR CREAMERS)  Tea (sugar ok, NO  MILK/CREAM OR CREAMERS) regular and decaf                             Plain Jell-O  with no fruit (NO RED)                                           Fruit ices (not with fruit pulp, NO RED)                                     Popsicles (NO RED)                                                                  Juice: apple, WHITE grape, WHITE cranberry Sports drinks like Gatorade or Powerade (NO RED)                   The day of surgery:  Drink ONE (1) Pre-Surgery G2 at  06:30   AM the morning of surgery. Drink  in one sitting. Do not sip.  This drink was given to you during your hospital pre-op appointment visit. Nothing else to drink after completing the Pre-Surgery G2 : No candy, chewing gum or throat lozenges.    FOLLOW  ANY ADDITIONAL PRE OP INSTRUCTIONS YOU RECEIVED FROM YOUR SURGEON'S OFFICE!!!   Oral Hygiene is also important to reduce your risk of infection.        Remember - BRUSH YOUR TEETH THE MORNING OF SURGERY WITH YOUR REGULAR TOOTHPASTE  Do NOT smoke after Midnight the night before surgery.  Take ONLY these medicines the morning of surgery with A SIP OF WATER:  ????   You may not have any metal on your body including  jewelry, and body piercing  Do not wear  lotions, powders, cologne, or deodorant   Men may shave face and neck.  Contacts, Hearing Aids, dentures or bridgework may not be worn into surgery. DENTURES WILL BE REMOVED PRIOR TO SURGERY PLEASE DO NOT APPLY "Poly grip" OR ADHESIVES!!!  Patients discharged on the day of surgery will not be allowed to drive home.  Someone NEEDS to stay with you for the first 24 hours after anesthesia.  Do not bring your home medications to the hospital. The Pharmacy will dispense medications listed on your medication list to you during your admission in the Hospital.  Please read over the following fact sheets you were given: IF YOU HAVE QUESTIONS ABOUT YOUR PRE-OP INSTRUCTIONS, PLEASE CALL  [phone].         Pre-operative 5 CHG Bath Instructions   You can play a key role in reducing the risk of infection after surgery. Your skin needs to be as free of germs as possible. You can reduce the number of germs on your skin by washing with CHG (chlorhexidine gluconate) soap before surgery. CHG is an antiseptic soap that kills germs and continues to kill germs even after washing.   DO NOT use if you have an allergy to chlorhexidine/CHG or antibacterial soaps. If your skin becomes reddened or irritated, stop using the CHG and notify one of our RNs at [phone]  Please shower with the CHG soap starting 4 days before surgery using the following schedule: START SHOWERS ON [facility] 06-27-2022                                                                                                                                                                                        Please keep in mind the following:  DO NOT shave, including legs and underarms, starting the day of your first shower.   You may shave your face at any point before/day of  surgery.   Place clean sheets on your bed the day you start using CHG soap. Use a clean washcloth (not used since being washed) for each shower. DO NOT sleep with pets once you start using the CHG.   CHG Shower Instructions:  If you choose to wash your hair and private area, wash first with your normal shampoo/soap.  After you use shampoo/soap, rinse your hair and body thoroughly to remove shampoo/soap residue.  Turn the water OFF and apply about 3 tablespoons (45 ml) of CHG soap to a CLEAN washcloth.  Apply CHG soap ONLY FROM YOUR NECK DOWN TO YOUR TOES (washing for 3-5 minutes)  DO NOT use CHG soap on face, private areas, open wounds, or sores.  Pay special attention to the area where your surgery is being performed.  If you are having back surgery, having someone wash your back for you may be helpful.  Wait 2 minutes after CHG soap  is applied, then you may rinse off the CHG soap.  Pat dry with a clean towel  Put on clean clothes/pajamas   If you choose to wear lotion, please use ONLY the CHG-compatible lotions on the back of this paper.     Additional instructions for the day of surgery: DO NOT APPLY any lotions, deodorants, cologne, or perfumes.   Put on clean/comfortable clothes.  Brush your teeth.  Ask your nurse before applying any prescription medications to the skin.      CHG Compatible Lotions   Aveeno Moisturizing lotion  Cetaphil Moisturizing Cream  Cetaphil Moisturizing Lotion  Clairol Herbal Essence Moisturizing Lotion, Dry Skin  Clairol Herbal Essence Moisturizing Lotion, Extra Dry Skin  Clairol Herbal Essence Moisturizing Lotion, Normal Skin  Curel Age Defying Therapeutic Moisturizing Lotion with Alpha Hydroxy  Curel Extreme Care Body Lotion  Curel Soothing Hands Moisturizing Hand Lotion  Curel Therapeutic Moisturizing Cream, Fragrance-Free  Curel Therapeutic Moisturizing Lotion, Fragrance-Free  Curel Therapeutic Moisturizing Lotion, Original Formula  Eucerin Daily Replenishing Lotion  Eucerin Dry Skin Therapy Plus Alpha Hydroxy Crme  Eucerin Dry Skin Therapy Plus Alpha Hydroxy Lotion  Eucerin Original Crme  Eucerin Original Lotion  Eucerin Plus Crme Eucerin Plus Lotion  Eucerin TriLipid Replenishing Lotion  Keri Anti-Bacterial Hand Lotion  Keri Deep Conditioning Original Lotion Dry Skin Formula Softly Scented  Keri Deep Conditioning Original Lotion, Fragrance Free Sensitive Skin Formula  Keri Lotion Fast Absorbing Fragrance Free Sensitive Skin Formula  Keri Lotion Fast Absorbing Softly Scented Dry Skin Formula  Keri Original Lotion  Keri Skin Renewal Lotion Keri Silky Smooth Lotion  Keri Silky Smooth Sensitive Skin Lotion  Nivea Body Creamy Conditioning Oil  Nivea Body Extra Enriched Lotion  Nivea Body Original Lotion  Nivea Body Sheer Moisturizing Lotion Nivea Crme  Nivea Skin  Firming Lotion  NutraDerm 30 Skin Lotion  NutraDerm Skin Lotion  NutraDerm Therapeutic Skin Cream  NutraDerm Therapeutic Skin Lotion  ProShield Protective Hand Cream  Provon moisturizing lotion     Preparing for Total Shoulder Arthroplasty ================================================================= Please follow these instructions carefully, in addition to any other special Bathing information that was explained to you at the Presurgical Appointment:  BENZOYL PEROXIDE 5% GEL: Used to kill bacteria on the skin which could cause an infection at the surgery site.   Please do not use if you have an allergy to benzoyl peroxide. If your skin becomes reddened/irritated stop using the benzoyl peroxide and inform your Doctor.   Starting two days before surgery, apply as follows:  1. Apply benzoyl peroxide  gel in the morning and at night. Apply after taking a shower. If you are not taking a shower, clean entire shoulder front, back, and side, along with the armpit with a clean wet washcloth.  2. Place a quarter-sized dollop of the gel on your SHOULDER and rub in thoroughly, making sure to cover the front, back, and side of your shoulder, along with the armpit.   2 Days prior to Surgery      [facility]  June 29, 2022 First Application _______ Morning Second Application _______ Night  Day Before Surgery           THURSDAY  June 30, 2022 First Application______ Morning  On the night before surgery, wash your entire body (except hair, face and private areas) with CHG Soap. THEN, rub in the LAST application of the Benzoyl Peroxide Gel on your shoulder.   3. On the Morning of Surgery wash your BODY AGAIN with CHG Soap (except hair, face and private areas)  4. DO NOT USE THE BENZOYL PEROXIDE GEL ON THE DAY OF YOUR SURGERY          Incentive Spirometer    An incentive spirometer is a tool that can help keep your lungs clear and active. This tool measures how well you are filling  your lungs with each breath. Taking long deep breaths may help reverse or decrease the chance of developing breathing (pulmonary) problems (especially infection) following: A long period of time when you are unable to move or be active. BEFORE THE PROCEDURE  If the spirometer includes an indicator to show your best effort, your nurse or respiratory therapist will set it to a desired goal. If possible, sit up straight or lean slightly forward. Try not to slouch. Hold the incentive spirometer in an upright position. INSTRUCTIONS FOR USE  Sit on the edge of your bed if possible, or sit up as far as you can in bed or on a chair. Hold the incentive spirometer in an upright position. Breathe out normally. Place the mouthpiece in your mouth and seal your lips tightly around it. Breathe in slowly and as deeply as possible, raising the piston or the ball toward the top of the column. Hold your breath for 3-5 seconds or for as long as possible. Allow the piston or ball to fall to the bottom of the column. Remove the mouthpiece from your mouth and breathe out normally. Rest for a few seconds and repeat Steps 1 through 7 at least 10 times every 1-2 hours when you are awake. Take your time and take a few normal breaths between deep breaths. The spirometer may include an indicator to show your best effort. Use the indicator as a goal to work toward during each repetition. After each set of 10 deep breaths, practice coughing to be sure your lungs are clear. If you have an incision (the cut made at the time of surgery), support your incision when coughing by placing a pillow or rolled up towels firmly against it. Once you are able to get out of bed, walk around indoors and cough well. You may stop using the incentive spirometer when instructed by your caregiver.  RISKS AND COMPLICATIONS Take your time so you do not get dizzy or light-headed. If you are in pain, you may need to take or ask for pain medication  before doing incentive spirometry. It is harder to take a deep breath if you are having pain. AFTER USE Rest and breathe slowly and easily. It  can be helpful to keep track of a log of your progress. Your caregiver can provide you with a simple table to help with this. If you are using the spirometer at home, follow these instructions: SEEK MEDICAL CARE IF:  You are having difficultly using the spirometer. You have trouble using the spirometer as often as instructed. Your pain medication is not giving enough relief while using the spirometer. You develop fever of 100.5 F (38.1 C) or higher.                                                                                                    SEEK IMMEDIATE MEDICAL CARE IF:  You cough up bloody sputum that had not been present before. You develop fever of 102 F (38.9 C) or greater. You develop worsening pain at or near the incision site. MAKE SURE YOU:  Understand these instructions. Will watch your condition. Will get help right away if you are not doing well or get worse. Document Released: 05/02/2006 Document Revised: 03/14/2011 Document Reviewed: 07/03/2006 [facility] Patient Information 2014 [place], [place].

## 2022-06-15 NOTE — Progress Notes (Signed)
COVID Vaccine received:  []  No [x]  Yes Date of any COVID positive Test in last 90 days:  PCP - Addison Lank, FNP at [facility]      Phone: [phone] Fax: [phone]  Cardiologist - Norman Herrlich, MD  Chest x-ray - 06-28-2018  2v   Epic EKG -  (10-21-2020  Epic)   04-05-2022  CE done at [facility], Have requested Stress Test - 10-14-2020  Epic ECHO - 10-21-2020  Epic Cardiac Cath -2017 and   07-29-2020  [facility] by Dr. Clifton James (Mild nonobstructive CAD)  PCR screen: [x]  Ordered & Completed           []   No Order but Needs PROFEND           []   N/A for this surgery  Surgery Plan:  [x]  Ambulatory                            []  Outpatient in bed                            []  Admit  Anesthesia:    []  General  []  Spinal                           [x]   Choice []   MAC  Pacemaker / ICD device [x]  No []  Yes   Spinal Cord Stimulator:[x]  No []  Yes       History of Sleep Apnea? [x]  No []  Yes   CPAP used?- [x]  No []  Yes    Does the patient monitor blood sugar?          []  No []  Yes  [x]  N/A  Patient has: [x]  NO Hx DM   []  Pre-DM                 []  DM1  []   DM2 Does patient have a [employer] or [employer]? []  No []  Yes   Fasting Blood Sugar Ranges-  Checks Blood Sugar _____ times a day  Blood Thinner / Instructions:  none Aspirin Instructions: none  ERAS Protocol Ordered: []  No  [x]  Yes PRE-SURGERY [x]  ENSURE  []  G2  Patient is to be NPO after: 06:30  am  Comments: Patient was given the 5 CHG shower / bath instructions for Reverse Total Shoulder arthroplasty surgery along with 2 bottles of the CHG soap. Patient will start this on:  Monday 06-27-22 All questions were asked and answered, Patient voiced understanding of this process.   The patient was given Benzoyl peroxide Gel as ordered. Instruction regarding application starting 2 days prior to surgery was given and patient voiced understanding.   Activity level: Patient is able / unable to climb a flight of stairs  without difficulty; []  No CP  []  No SOB, but would have ___   Patient can / can not perform ADLs without assistance.   Anesthesia review: HTN, Anxiety, Remote hx Hep A- current ? liver function tests, DOE,   Patient denies shortness of breath, fever, cough and chest pain at PAT appointment.  Patient verbalized understanding and agreement to the Pre-Surgical Instructions that were given to them at this PAT appointment. Patient was also educated of the need to review these PAT instructions again prior to his surgery.I reviewed the appropriate phone numbers to call if they have any and questions or concerns.

## 2022-06-20 ENCOUNTER — Encounter (HOSPITAL_COMMUNITY)
Admission: RE | Admit: 2022-06-20 | Discharge: 2022-06-20 | Disposition: A | Discharge status: Home / Self Care | Payer: 59 | Source: Ambulatory Visit | Attending: Orthopedic Surgery | Admitting: Orthopedic Surgery

## 2022-06-20 DIAGNOSIS — I10 Essential (primary) hypertension: Secondary | ICD-10-CM

## 2022-06-20 DIAGNOSIS — Z01818 Encounter for other preprocedural examination: Secondary | ICD-10-CM

## 2022-06-20 DIAGNOSIS — R7989 Other specified abnormal findings of blood chemistry: Secondary | ICD-10-CM

## 2022-07-20 NOTE — Patient Instructions (Addendum)
SURGICAL WAITING ROOM VISITATION Patients having surgery or a procedure may have no more than 2 support people in the waiting area - these visitors may rotate.    Children under the age of 83 must have an adult with them who is not the patient.  If the patient needs to stay at the hospital during part of their recovery, the visitor guidelines for inpatient rooms apply. Pre-op nurse will coordinate an appropriate time for 1 support person to accompany patient in pre-op.  This support person may not rotate.    Please refer to the [facility] website for the visitor guidelines for Inpatients (after your surgery is over and you are in a regular room).       Your procedure is scheduled on: 08-12-22   Report to [facility] Main Entrance    Report to admitting at 10:00 AM   Call this number if you have problems the morning of surgery [phone]   Do not eat food :After Midnight.   After Midnight you may have the following liquids until 9:20 AM DAY OF SURGERY  Water Non-Citrus Juices (without pulp, NO RED-Apple, White grape, White cranberry) Black Coffee (NO MILK/CREAM OR CREAMERS, sugar ok)  Clear Tea (NO MILK/CREAM OR CREAMERS, sugar ok) regular and decaf                             Plain Jell-O (NO RED)                                           Fruit ices (not with fruit pulp, NO RED)                                     Popsicles (NO RED)                                                               Sports drinks like Gatorade (NO RED)                   The day of surgery:  Drink ONE (1) Pre-Surgery Clear Ensure  at 9:20 AM the morning of surgery. Drink in one sitting. Do not sip.  This drink was given to you during your hospital  pre-op appointment visit. Nothing else to drink after completing the Pre-Surgery Clear Ensure           If you have questions, please contact your surgeon's office.  FOLLOW  ANY ADDITIONAL PRE OP INSTRUCTIONS YOU RECEIVED FROM YOUR SURGEON'S  OFFICE!!!   Oral Hygiene is also important to reduce your risk of infection.                                    Remember - BRUSH YOUR TEETH THE MORNING OF SURGERY WITH YOUR REGULAR TOOTHPASTE   Do NOT smoke after Midnight   Take these medicines the morning of surgery with A SIP OF WATER:   Zyrtec  Oxycodone if needed  Okay to use eyedrops  Bring CPAP mask and tubing day of surgery.                              You may not have any metal on your body including jewelry, and body piercing             Do not wear lotions, powders, cologne, or deodorant              Men may shave face and neck.   Do not bring valuables to the hospital. [place] IS NOT RESPONSIBLE   FOR VALUABLES.   Contacts, dentures or bridgework may not be worn into surgery.  DO NOT BRING YOUR HOME MEDICATIONS TO THE HOSPITAL. PHARMACY WILL DISPENSE MEDICATIONS LISTED ON YOUR MEDICATION LIST TO YOU DURING YOUR ADMISSION IN THE HOSPITAL!    Patients discharged on the day of surgery will not be allowed to drive home.  Someone NEEDS to stay with you for the first 24 hours after anesthesia.   Special Instructions: Bring a copy of your healthcare power of attorney and living will documents the day of surgery if you haven't scanned them before.              Please read over the following fact sheets you were given: IF YOU HAVE QUESTIONS ABOUT YOUR PRE-OP INSTRUCTIONS PLEASE CALL [phone] [facility]  If you received a COVID test during your pre-op visit  it is requested that you wear a mask when out in public, stay away from anyone that may not be feeling well and notify your surgeon if you develop symptoms. If you test positive for Covid or have been in contact with anyone that has tested positive in the last 10 days please notify you surgeon.  [facility]- Preparing for Total Shoulder Arthroplasty    Before surgery, you can play an important role. Because skin is not sterile, your skin needs to be as free of germs as  possible. You can reduce the number of germs on your skin by using the following products. Benzoyl Peroxide Gel Reduces the number of germs present on the skin Applied twice a day to shoulder area starting two days before surgery    ==================================================================  Please follow these instructions carefully:  BENZOYL PEROXIDE 5% GEL  Please do not use if you have an allergy to benzoyl peroxide.   If your skin becomes reddened/irritated stop using the benzoyl peroxide.  Starting two days before surgery, apply as follows: Apply benzoyl peroxide in the morning and at night. Apply after taking a shower. If you are not taking a shower clean entire shoulder front, back, and side along with the armpit with a clean wet washcloth.  Place a quarter-sized dollop on your shoulder and rub in thoroughly, making sure to cover the front, back, and side of your shoulder, along with the armpit.   2 days before ____ AM   ____ PM              1 day before ____ AM   ____ PM                         Do this twice a day for two days.  (Last application is the night before surgery, AFTER using the CHG soap as described below).  Do NOT apply benzoyl peroxide gel on the day of surgery.    Pre-operative 5  CHG Bath Instructions   You can play a key role in reducing the risk of infection after surgery. Your skin needs to be as free of germs as possible. You can reduce the number of germs on your skin by washing with CHG (chlorhexidine gluconate) soap before surgery. CHG is an antiseptic soap that kills germs and continues to kill germs even after washing.   DO NOT use if you have an allergy to chlorhexidine/CHG or antibacterial soaps. If your skin becomes reddened or irritated, stop using the CHG and notify one of our RNs at  [phone] .   Please shower with the CHG soap starting 4 days before surgery using the following schedule:     Please keep in mind the following:   DO NOT shave, including legs and underarms, starting the day of your first shower.   You may shave your face at any point before/day of surgery.  Place clean sheets on your bed the day you start using CHG soap. Use a clean washcloth (not used since being washed) for each shower. DO NOT sleep with pets once you start using the CHG.   CHG Shower Instructions:  If you choose to wash your hair and private area, wash first with your normal shampoo/soap.  After you use shampoo/soap, rinse your hair and body thoroughly to remove shampoo/soap residue.  Turn the water OFF and apply about 3 tablespoons (45 ml) of CHG soap to a CLEAN washcloth.  Apply CHG soap ONLY FROM YOUR NECK DOWN TO YOUR TOES (washing for 3-5 minutes)  DO NOT use CHG soap on face, private areas, open wounds, or sores.  Pay special attention to the area where your surgery is being performed.  If you are having back surgery, having someone wash your back for you may be helpful. Wait 2 minutes after CHG soap is applied, then you may rinse off the CHG soap.  Pat dry with a clean towel  Put on clean clothes/pajamas   If you choose to wear lotion, please use ONLY the CHG-compatible lotions on the back of this paper.     Additional instructions for the day of surgery: DO NOT APPLY any lotions, deodorants, cologne, or perfumes.   Put on clean/comfortable clothes.  Brush your teeth.  Ask your nurse before applying any prescription medications to the skin.      CHG Compatible Lotions   Aveeno Moisturizing lotion  Cetaphil Moisturizing Cream  Cetaphil Moisturizing Lotion  Clairol Herbal Essence Moisturizing Lotion, Dry Skin  Clairol Herbal Essence Moisturizing Lotion, Extra Dry Skin  Clairol Herbal Essence Moisturizing Lotion, Normal Skin  Curel Age Defying Therapeutic Moisturizing Lotion with Alpha Hydroxy  Curel Extreme Care Body Lotion  Curel Soothing Hands Moisturizing Hand Lotion  Curel Therapeutic Moisturizing Cream,  Fragrance-Free  Curel Therapeutic Moisturizing Lotion, Fragrance-Free  Curel Therapeutic Moisturizing Lotion, Original Formula  Eucerin Daily Replenishing Lotion  Eucerin Dry Skin Therapy Plus Alpha Hydroxy Crme  Eucerin Dry Skin Therapy Plus Alpha Hydroxy Lotion  Eucerin Original Crme  Eucerin Original Lotion  Eucerin Plus Crme Eucerin Plus Lotion  Eucerin TriLipid Replenishing Lotion  Keri Anti-Bacterial Hand Lotion  Keri Deep Conditioning Original Lotion Dry Skin Formula Softly Scented  Keri Deep Conditioning Original Lotion, Fragrance Free Sensitive Skin Formula  Keri Lotion Fast Absorbing Fragrance Free Sensitive Skin Formula  Keri Lotion Fast Absorbing Softly Scented Dry Skin Formula  Keri Original Lotion  Keri Skin Renewal Lotion Keri Silky Smooth Lotion  Keri Silky Smooth Sensitive Skin Lotion  Nivea Body Creamy Conditioning Oil  Nivea Body Extra Enriched Lotion  Nivea Body Original Lotion  Nivea Body Sheer Moisturizing Lotion Nivea Crme  Nivea Skin Firming Lotion  NutraDerm 30 Skin Lotion  NutraDerm Skin Lotion  NutraDerm Therapeutic Skin Cream  NutraDerm Therapeutic Skin Lotion  ProShield Protective Hand Cream  Provon moisturizing lotion   PATIENT SIGNATURE_________________________________  NURSE SIGNATURE__________________________________  ________________________________________________________________________    Rogelia Mire  An incentive spirometer is a tool that can help keep your lungs clear and active. This tool measures how well you are filling your lungs with each breath. Taking long deep breaths may help reverse or decrease the chance of developing breathing (pulmonary) problems (especially infection) following: A long period of time when you are unable to move or be active. BEFORE THE PROCEDURE  If the spirometer includes an indicator to show your best effort, your nurse or respiratory therapist will set it to a desired goal. If possible,  sit up straight or lean slightly forward. Try not to slouch. Hold the incentive spirometer in an upright position. INSTRUCTIONS FOR USE  Sit on the edge of your bed if possible, or sit up as far as you can in bed or on a chair. Hold the incentive spirometer in an upright position. Breathe out normally. Place the mouthpiece in your mouth and seal your lips tightly around it. Breathe in slowly and as deeply as possible, raising the piston or the ball toward the top of the column. Hold your breath for 3-5 seconds or for as long as possible. Allow the piston or ball to fall to the bottom of the column. Remove the mouthpiece from your mouth and breathe out normally. Rest for a few seconds and repeat Steps 1 through 7 at least 10 times every 1-2 hours when you are awake. Take your time and take a few normal breaths between deep breaths. The spirometer may include an indicator to show your best effort. Use the indicator as a goal to work toward during each repetition. After each set of 10 deep breaths, practice coughing to be sure your lungs are clear. If you have an incision (the cut made at the time of surgery), support your incision when coughing by placing a pillow or rolled up towels firmly against it. Once you are able to get out of bed, walk around indoors and cough well. You may stop using the incentive spirometer when instructed by your caregiver.  RISKS AND COMPLICATIONS Take your time so you do not get dizzy or light-headed. If you are in pain, you may need to take or ask for pain medication before doing incentive spirometry. It is harder to take a deep breath if you are having pain. AFTER USE Rest and breathe slowly and easily. It can be helpful to keep track of a log of your progress. Your caregiver can provide you with a simple table to help with this. If you are using the spirometer at home, follow these instructions: SEEK MEDICAL CARE IF:  You are having difficultly using the  spirometer. You have trouble using the spirometer as often as instructed. Your pain medication is not giving enough relief while using the spirometer. You develop fever of 100.5 F (38.1 C) or higher. SEEK IMMEDIATE MEDICAL CARE IF:  You cough up bloody sputum that had not been present before. You develop fever of 102 F (38.9 C) or greater. You develop worsening pain at or near the incision site. MAKE SURE YOU:  Understand these instructions. Will watch  your condition. Will get help right away if you are not doing well or get worse. Document Released: 05/02/2006 Document Revised: 03/14/2011 Document Reviewed: 07/03/2006 [facility] Patient Information 2014 [place], [place].   ________________________________________________________________________

## 2022-07-20 NOTE — Progress Notes (Addendum)
COVID Vaccine Completed:  Yes  Date of COVID positive in last 90 days:  PCP - Molli Hazard, PA-C Cardiologist - Norman Herrlich, MD Pulmonology - Chilton Greathouse, MD (last OV 2018)  Chest x-ray -  EKG - 04-05-22 [facility], copy on chart Stress Test - 10/14/20 Epic ECHO - 10-21-20 Epic Cardiac Cath - 07-29-20 Epic Pacemaker/ICD device last checked: Spinal Cord Stimulator:  Bowel Prep -   Sleep Study -  CPAP -   Fasting Blood Sugar -  Checks Blood Sugar _____ times a day  Last dose of GLP1 agonist-  N/A GLP1 instructions:  N/A   Last dose of SGLT-2 inhibitors-  N/A SGLT-2 instructions: N/A   Blood Thinner Instructions:  Time Aspirin Instructions: Last Dose:  Activity level:  Can go up a flight of stairs and perform activities of daily living without stopping and without symptoms of chest pain or shortness of breath.  Able to exercise without symptoms  Unable to go up a flight of stairs without symptoms of     Anesthesia review:  Chest pain and dyspnea evaluated by cardiology.  Patient denies shortness of breath, fever, cough and chest pain at PAT appointment  Patient verbalized understanding of instructions that were given to them at the PAT appointment. Patient was also instructed that they will need to review over the PAT instructions again at home before surgery.

## 2022-08-01 ENCOUNTER — Encounter (HOSPITAL_COMMUNITY)
Admission: RE | Admit: 2022-08-01 | Discharge: 2022-08-01 | Disposition: A | Discharge status: Home / Self Care | Payer: 59 | Source: Ambulatory Visit | Attending: Physician Assistant | Admitting: Physician Assistant

## 2022-08-12 ENCOUNTER — Ambulatory Visit (HOSPITAL_COMMUNITY): Admission: RE | Admit: 2022-08-12 | Payer: 59 | Source: Home / Self Care | Admitting: Orthopedic Surgery

## 2022-11-11 ENCOUNTER — Encounter (HOSPITAL_COMMUNITY): Admission: RE | Payer: Self-pay | Source: Home / Self Care

## 2022-11-11 SURGERY — ARTHROPLASTY, SHOULDER, TOTAL, REVERSE
Anesthesia: Choice | Site: Shoulder | Laterality: Left

## 2023-11-27 ENCOUNTER — Other Ambulatory Visit

## 2023-11-27 ENCOUNTER — Encounter: Payer: Self-pay | Admitting: Gastroenterology

## 2023-11-27 ENCOUNTER — Ambulatory Visit: Admitting: Gastroenterology

## 2023-11-27 VITALS — BP 138/74 | HR 75 | Ht 70.0 in | Wt 247.0 lb

## 2023-11-27 DIAGNOSIS — K920 Hematemesis: Secondary | ICD-10-CM

## 2023-11-27 DIAGNOSIS — K921 Melena: Secondary | ICD-10-CM

## 2023-11-27 DIAGNOSIS — R1319 Other dysphagia: Secondary | ICD-10-CM | POA: Diagnosis not present

## 2023-11-27 DIAGNOSIS — R1013 Epigastric pain: Secondary | ICD-10-CM

## 2023-11-27 DIAGNOSIS — D649 Anemia, unspecified: Secondary | ICD-10-CM | POA: Diagnosis not present

## 2023-11-27 DIAGNOSIS — R7989 Other specified abnormal findings of blood chemistry: Secondary | ICD-10-CM

## 2023-11-27 DIAGNOSIS — K760 Fatty (change of) liver, not elsewhere classified: Secondary | ICD-10-CM

## 2023-11-27 DIAGNOSIS — G8929 Other chronic pain: Secondary | ICD-10-CM

## 2023-11-27 LAB — COMPREHENSIVE METABOLIC PANEL WITH GFR
ALT: 68 U/L — ABNORMAL HIGH (ref 0–53)
AST: 52 U/L — ABNORMAL HIGH (ref 0–37)
Albumin: 4 g/dL (ref 3.5–5.2)
Alkaline Phosphatase: 72 U/L (ref 39–117)
BUN: 9 mg/dL (ref 6–23)
CO2: 27 meq/L (ref 19–32)
Calcium: 9.4 mg/dL (ref 8.4–10.5)
Chloride: 103 meq/L (ref 96–112)
Creatinine, Ser: 0.97 mg/dL (ref 0.40–1.50)
GFR: 85.06 mL/min (ref >60.00)
Glucose, Bld: 86 mg/dL (ref 70–99)
Potassium: 4.3 meq/L (ref 3.5–5.1)
Sodium: 140 meq/L (ref 135–145)
Total Bilirubin: 0.4 mg/dL (ref 0.2–1.2)
Total Protein: 6.4 g/dL (ref 6.0–8.3)

## 2023-11-27 LAB — IBC + FERRITIN
Ferritin: 7 ng/mL — ABNORMAL LOW (ref 22.0–322.0)
Iron: 12 ug/dL — ABNORMAL LOW (ref 42–165)
Saturation Ratios: 2.2 % — ABNORMAL LOW (ref 20.0–50.0)
TIBC: 546 ug/dL — ABNORMAL HIGH (ref 250.0–450.0)
Transferrin: 390 mg/dL — ABNORMAL HIGH (ref 212.0–360.0)

## 2023-11-27 LAB — CBC WITH DIFFERENTIAL/PLATELET
Basophils Absolute: 0.1 K/uL (ref 0.0–0.1)
Basophils Relative: 2.1 % (ref 0.0–3.0)
Eosinophils Absolute: 0.5 K/uL (ref 0.0–0.7)
Eosinophils Relative: 8 % — ABNORMAL HIGH (ref 0.0–5.0)
HCT: 34.9 % — ABNORMAL LOW (ref 39.0–52.0)
Hemoglobin: 11.5 g/dL — ABNORMAL LOW (ref 13.0–17.0)
Lymphocytes Relative: 34.5 % (ref 12.0–46.0)
Lymphs Abs: 2.1 K/uL (ref 0.7–4.0)
MCHC: 33 g/dL (ref 30.0–36.0)
MCV: 73.6 fl — ABNORMAL LOW (ref 78.0–100.0)
Monocytes Absolute: 0.6 K/uL (ref 0.1–1.0)
Monocytes Relative: 9.6 % (ref 3.0–12.0)
Neutro Abs: 2.8 K/uL (ref 1.4–7.7)
Neutrophils Relative %: 45.8 % (ref 43.0–77.0)
Platelets: 409 K/uL — ABNORMAL HIGH (ref 150.0–400.0)
RBC: 4.74 Mil/uL (ref 4.22–5.81)
RDW: 15.9 % — ABNORMAL HIGH (ref 11.5–15.5)
WBC: 6.2 K/uL (ref 4.0–10.5)

## 2023-11-27 LAB — B12 AND FOLATE PANEL
Folate: 6.8 ng/mL (ref >5.9)
Vitamin B-12: 500 pg/mL (ref 211–911)

## 2023-11-27 MED ORDER — NA SULFATE-K SULFATE-MG SULF 17.5-3.13-1.6 GM/177ML PO SOLN: 1.0000 | Freq: Once | ORAL | 0 refills | Status: AC

## 2023-11-27 MED ORDER — PANTOPRAZOLE SODIUM 40 MG PO TBEC
40.0000 mg | DELAYED_RELEASE_TABLET | Freq: Two times a day (BID) | ORAL | 3 refills | Status: AC
Start: 2023-11-27 — End: ?

## 2023-11-27 NOTE — Progress Notes (Addendum)
 Chief Complaint:Abdominal Pain and Hematemesis. Primary GI Doctor: Dr. San  HPI:  Patient is a  58  year old male patient with past medical history of HTN, HLD, CAD (20% mRCA and mLCx treated medically), who was referred to me by Andra Evalene Hamilton, MD on 11/17/23 for a evaluation of Abdominal Pain and Hematemesis. SABRA    11/17/23 patient seen at [facility] ER for evaluation of Abdominal Pain and Hematemesis.Takes naproxen as needed for back pain.Initially tachycardic, resolved with IVF. Hemoglobin 13.3 > 11.5 after 1 L IVF. No recurrent hematemesis, DRE negative for melena. CT occult GI bleed negative. No obvious melena with DRE.They have a symptomatic findings and laboratory workup that Kalyssa Anker be indicative of eosinophilic esophagitis and the eosinophilia on the CBC.   Interval History Patient presents for evaluation after recent ED visit.  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 He went home with dry heaves and dizziness.  He reports later that night he threw up again and noticed dark red color mixed in with mucous. He then vomited a third time and it was dark black, patient reports looked like lava. Patient started on pantoprazole  20mg  po daily which he reports has not helped with the epigastric pain. Reports epigastric pain started the day he threw up.He reports it radiates across the top of his stomach. Patient reports occasional reflux symptoms. Patient reports intermittent esophageal dysphagia with solids. Patient reports he cut out meat few months ago because of choking. Patient reports soft food diet tolerated better.  Patient has report weight loss over 3-4 months ago, intentionally. Cut out meat and plans to be vegetarian.   He reports day of incident and 2-3 days after he had dark stools. Since then they returned back to regular brown color. He reports one regular BM daily.  Patient takes Naproxen once po daily.   Patient on baby ASA mg po daily   Patient drinks few glasses of wine a week. Nonsmoker.  Patient walks 3-4 miles per day.  Patients last colonoscopy was two years ago with Dr. Elray in [place]. Per patient had 3 benign polyps. Recommendations to repeat in 2 years?  Never had EGD.   Surgical history: gallbladder removed  Patient's family history includes: His father had esophageal stricture who required stretching, aunt with breast CA, two uncles with lung CA  Wt Readings from Last 3 Encounters:  11/27/23 247 lb (112 kg)  07/04/18 260 lb 5.8 oz (118.1 kg)  06/28/18 260 lb 4.8 oz (118.1 kg)    Past Medical History:  Diagnosis Date   Anxiety    Arthritis    of facet joint of lumbar spine   Chest pain    Chronic headaches    Dysphagia    ED (erectile dysfunction)    Environmental and seasonal allergies    Hepatitis    Hep A in 80's   Hypercholesterolemia    Hypertension    Myalgia    SOB (shortness of breath)    Spinal stenosis    Vertigo    Vitamin D deficiency    Wears glasses     Past Surgical History:  Procedure Laterality Date   BACK SURGERY Right    CARDIAC CATHETERIZATION N/A 11/30/2015   Procedure: Left Heart Cath and Coronary Angiography;  Surgeon: Maude JAYSON Emmer, MD;  Location: [facility] IN[facility];  Service: Cardiovascular;  Laterality: N/A;   KNEE SURGERY     LEFT HEART CATH AND CORONARY  ANGIOGRAPHY N/A 07/29/2020   Procedure: LEFT HEART CATH AND CORONARY ANGIOGRAPHY;  Surgeon: Verlin Lonni BIRCH, MD;  Location: [facility] INVAS[facility] CV [facility];  Service: Cardiovascular;  Laterality: N/A;   LUMBAR LAMINECTOMY/DECOMPRESSION MICRODISCECTOMY Right 07/04/2018   Procedure: Revision L5-S1 right gill decompression;  Surgeon: Burnetta Aures, MD;  Location: [facility] OR;  Service: Orthopedics;  Laterality: Right;  2.5 hrs   SHOULDER SURGERY      Current Outpatient Medications  Medication Sig Dispense Refill   albuterol  (PROAIR  HFA) 108 (90 Base) MCG/ACT inhaler Inhale 2 puffs  into the lungs every 6 (six) hours as needed for wheezing or shortness of breath. 1 Inhaler 3   aspirin  EC 81 MG tablet Take 1 tablet (81 mg total) by mouth daily. Swallow whole. 90 tablet 1   cetirizine (ZYRTEC) 10 MG tablet Take 10 mg by mouth daily as needed for allergies.     colchicine  0.6 MG tablet Take 1 tablet (0.6 mg total) by mouth 2 (two) times daily for 5 days. 10 tablet 0   furosemide  (LASIX ) 20 MG tablet TAKE 1 TABLET BY MOUTH EVERY DAY 30 tablet 0   gabapentin  (NEURONTIN ) 400 MG capsule Take 400 mg by mouth at bedtime.     Na Sulfate-K Sulfate-Mg Sulfate concentrate (SUPREP) 17.5-3.13-1.6 GM/177ML SOLN Take 1 kit (354 mLs total) by mouth once for 1 dose. 354 mL 0   ondansetron  (ZOFRAN ) 4 MG tablet Take 1 tablet (4 mg total) by mouth every 8 (eight) hours as needed for nausea or vomiting. 20 tablet 0   OVER THE COUNTER MEDICATION Take 2 capsules by mouth daily. Sea [place] Advance     oxyCODONE -acetaminophen  (PERCOCET) 10-325 MG tablet Take 1 tablet by mouth 4 (four) times daily as needed for pain.     pantoprazole  (PROTONIX ) 40 MG tablet Take 1 tablet (40 mg total) by mouth 2 (two) times daily. 180 tablet 3   tetrahydrozoline-zinc (VISINE-AC) 0.05-0.25 % ophthalmic solution Place 2 drops into both eyes daily as needed (itchy eyes).     No current facility-administered medications for this visit.    Allergies as of 11/27/2023   (No Known Allergies)    Family History  Problem Relation Age of Onset   Hypertension Mother    Hypertension Father    Hyperlipidemia Father     Review of Systems:    Constitutional: No weight loss, fever, chills, weakness or fatigue HEENT: Eyes: No change in vision               Ears, Nose, Throat:  No change in hearing or congestion Skin: No rash or itching Cardiovascular: No chest pain, chest pressure or palpitations   Respiratory: No SOB or cough Gastrointestinal: See HPI and otherwise negative Genitourinary: No dysuria or change in urinary  frequency Neurological: No headache, dizziness or syncope Musculoskeletal: No new muscle or joint pain Hematologic: No bleeding or bruising Psychiatric: No history of depression or anxiety    Physical Exam:  Vital signs: BP 138/74   Pulse 75   Ht 5' 10 (1.778 m)   Wt 247 lb (112 kg)   BMI 35.44 kg/m   Constitutional:   Pleasant male appears to be in NAD, Well developed, Well nourished, alert and cooperative Throat: Oral cavity and pharynx without inflammation, swelling or lesion.  Respiratory: Respirations even and unlabored. Lungs clear to auscultation bilaterally.   No wheezes, crackles, or rhonchi.  Cardiovascular: Normal S1, S2. Regular rate and rhythm. No peripheral edema, cyanosis or pallor.  Gastrointestinal:  Soft, nondistended,  epigastric tenderness with palpation. No rebound or guarding. Normal bowel sounds. No appreciable masses or hepatomegaly. Rectal:  Not performed.  Msk:  Symmetrical without gross deformities. Without edema, no deformity or joint abnormality.  Neurologic:  Alert and  oriented x4;  grossly normal neurologically.  Skin:   Dry and intact without significant lesions or rashes.  RELEVANT LABS AND IMAGING: CBC    Latest Ref Rng & Units 11/27/2023    9:45 AM 06/28/2018   10:18 AM 01/06/2016   12:13 PM  CBC  WBC 4.0 - 10.5 K/uL 6.2  6.0  8.2   Hemoglobin 13.0 - 17.0 g/dL 88.4  84.7  84.2   Hematocrit 39.0 - 52.0 % 34.9  45.4  45.8   Platelets 150.0 - 400.0 K/uL 409.0  321  310.0      CMP     Latest Ref Rng & Units 11/27/2023    9:45 AM 06/28/2018    2:00 PM 06/28/2018   10:18 AM  CMP  Glucose 70 - 99 mg/dL 86   97   BUN 6 - 23 mg/dL 9   10   Creatinine 9.59 - 1.50 mg/dL 9.02   9.01   Sodium 864 - 145 mEq/L 140   136   Potassium 3.5 - 5.1 mEq/L 4.3   3.8   Chloride 96 - 112 mEq/L 103   102   CO2 19 - 32 mEq/L 27   24   Calcium  8.4 - 10.5 mg/dL 9.4   8.7   Total Protein 6.0 - 8.3 g/dL 6.4  6.6    Total Bilirubin 0.2 - 1.2 mg/dL 0.4  1.4     Alkaline Phos 39 - 117 U/L 72  64    AST 0 - 37 U/L 52  41    ALT 0 - 53 U/L 68  51    11/17/23 labs show: hgb 11.5, BUN 17, creat 1.0, AST 58,ALT 75, eosinophil count 0.85, Eosinophil % 8.7, lipase 76, PT 12.4, INR 1.1, PTT 30.3  10/22 echo- LVF is normal with an EF of 55-60%  11/17/23 CTAP Impression:  1. Hepatic steatosis.  2.  Otherwise no acute finding in abdomen or pelvis. No active  gastrointestinal hemorrhage.    Fibrosis 4 Score = .93 (Low risk)        Interpretation for patients with NAFLD          <1.30       -  F0-F1 (Low risk)          1.30-2.67 -  Indeterminate           >2.67      -  F3-F4 (High risk)     Validated for ages 18-65   Assessment: Encounter Diagnoses  Name Primary?   Hematemesis with nausea Yes   Melena    Anemia, unspecified type    Esophageal dysphagia    Abdominal pain, chronic, epigastric    Hepatic steatosis    Elevated LFTs      58 year old African-American male that presents for follow-up after recent ED visit or patient reported hematemesis with nausea, epigastric pain,and melena.  CT scan showed no acute findings no active bleeding.  Hemoglobin dropped from 13.3-11.5 but this was after fluid administration.  Recommendations for endoscopy to further evaluate for eosinophilic esophagitis. Increased eosinophils on labs.  Patient reports that he nausea with vomiting and melena has improved however he continues with epigastric pain.  Patient on pantoprazole  20 mg p.o. daily will go ahead and  increase to 40 mg twice daily.  Will also recheck labs to evaluate hemoglobin level.  Patient does admit to taking naproxen daily along with a 81 mg baby aspirin .  This increases the risk for peptic ulcer disease. Will go ahead and schedule upper GI endoscopy to evaluate and rule out EOE, esophagitis and/or peptic ulcer disease.    Patient reports history of colonic polyps with recommendations to follow-up this year for colon screening colonoscopy, do not have  the records on hand but will go ahead and request.  With patient's recent drop in hemoglobin would be appropriate to further evaluate with upper endoscopy and colonoscopy.  Will go ahead and schedule colonoscopy in [facility] with Dr. San.    Lastly hepatic steatosis incidentally found on CT scan with elevated LFTs. Recheck today. Per records LFTs elevated dating back to 2017. Imaging as early as 12/2010 shows CTAP with fatty liver. Patient reports no alcohol use.  No history of fatty liver or hepatitis.  Patient recently lost about 23 pounds with cutting out meat and going vegetarian.  Recommended Mediterranean diet with physical activity as tolerated and weight loss.  Can further evaluate at follow-up if still elevated. Fib4 score low risk.  Plan: -recheck CBC, CMP, anemia panel -increase Pantoprazole  to 40 mg twice daily -GERD diet, no late meals   -No NSAID's -recommend dietary changes, weight loss, and exercise as tolerated -Schedule in [facility] with Dr. San. The risks and benefits of EGD with possible biopsies and esophageal dilation were discussed with the patient who agrees to proceed. -Schedule for a colonoscopy in [facility] with Dr. San. The risks and benefits of colonoscopy with possible polypectomy / biopsies were discussed and the patient agrees to proceed.  -request records from [facility] for [facility]  Thank you for the courtesy of this consult. Please call me with any questions or concerns.   Renika Shiflet, FNP-C [facility] 11/27/2023, 12:18 PM  Cc: No ref. provider found

## 2023-11-27 NOTE — Patient Instructions (Addendum)
 GERD Recommend GERD diet, no late meals  Start pantoprazole  40 mg twice daily, take one tablet 30-45 minutes before breakfast and dinner  Epigastric pain Same as above No NSAIDs Discuss with pain management about alternative to Naproxen  Diverticulosis   Your provider has requested that you go to the basement level for lab work before leaving today. Press B on the elevator. The lab is located at the first door on the left as you exit the elevator.  You have been scheduled for an endoscopy and colonoscopy. Please follow the written instructions given to you at your visit today.  If you use inhalers (even only as needed), please bring them with you on the day of your procedure.  DO NOT TAKE 7 DAYS PRIOR TO TEST- Trulicity (dulaglutide) Ozempic, Wegovy (semaglutide) Mounjaro, Zepbound (tirzepatide) Bydureon Bcise (exanatide extended release)  DO NOT TAKE 1 DAY PRIOR TO YOUR TEST Rybelsus (semaglutide) Adlyxin (lixisenatide) Victoza (liraglutide) Byetta (exanatide) ___________________________________________________________________________  Due to recent changes in healthcare laws, you may see the results of your imaging and laboratory studies on MyChart before your provider has had a chance to review them.  We understand that in some cases there may be results that are confusing or concerning to you. Not all laboratory results come back in the same time frame and the provider may be waiting for multiple results in order to interpret others.  Please give us  48 hours in order for your provider to thoroughly review all the results before contacting the office for clarification of your results.   _______________________________________________________  If your blood pressure at your visit was 140/90 or greater, please contact your primary care physician to follow up on this.  _______________________________________________________  If you are age 60 or older, your body mass index  should be between 23-30. Your Body mass index is 35.44 kg/m. If this is out of the aforementioned range listed, please consider follow up with your Primary Care Provider.  If you are age 60 or younger, your body mass index should be between 19-25. Your Body mass index is 35.44 kg/m. If this is out of the aformentioned range listed, please consider follow up with your Primary Care Provider.   ________________________________________________________  The [facility] providers would like to encourage you to use MYCHART to communicate with providers for non-urgent requests or questions.  Due to long hold times on the telephone, sending your provider a message by [facility] may be a faster and more efficient way to get a response.  Please allow 48 business hours for a response.  Please remember that this is for non-urgent requests.  _______________________________________________________  [facility] is using a team-based approach to care.  Your team is made up of your doctor and two to three APPS. Our APPS (Nurse Practitioners and Physician Assistants) work with your physician to ensure care continuity for you. They are fully qualified to address your health concerns and develop a treatment plan. They communicate directly with your gastroenterologist to care for you. Seeing the Advanced Practice Practitioners on your physician's team can help you by facilitating care more promptly, often allowing for earlier appointments, access to diagnostic testing, procedures, and other specialty referrals.   Thank you for trusting me with your gastrointestinal care. Deanna May, FNP-C

## 2023-11-28 ENCOUNTER — Ambulatory Visit: Payer: Self-pay | Admitting: Gastroenterology

## 2023-12-08 NOTE — Progress Notes (Signed)
 Agree with the assessment and plan as outlined by [facility], FNP-C.  Carlitos Bottino, DO, [facility]

## 2023-12-14 ENCOUNTER — Encounter: Payer: Self-pay | Admitting: Gastroenterology

## 2023-12-21 ENCOUNTER — Ambulatory Visit: Admitting: Gastroenterology

## 2023-12-21 VITALS — BP 159/83 | HR 83 | Temp 98.1°F | Resp 12 | Ht 70.0 in | Wt 247.0 lb

## 2023-12-21 DIAGNOSIS — K921 Melena: Secondary | ICD-10-CM | POA: Diagnosis not present

## 2023-12-21 DIAGNOSIS — K2970 Gastritis, unspecified, without bleeding: Secondary | ICD-10-CM

## 2023-12-21 DIAGNOSIS — K2950 Unspecified chronic gastritis without bleeding: Secondary | ICD-10-CM

## 2023-12-21 DIAGNOSIS — K641 Second degree hemorrhoids: Secondary | ICD-10-CM

## 2023-12-21 DIAGNOSIS — K222 Esophageal obstruction: Secondary | ICD-10-CM | POA: Diagnosis not present

## 2023-12-21 DIAGNOSIS — Z1211 Encounter for screening for malignant neoplasm of colon: Secondary | ICD-10-CM | POA: Diagnosis not present

## 2023-12-21 DIAGNOSIS — K317 Polyp of stomach and duodenum: Secondary | ICD-10-CM

## 2023-12-21 DIAGNOSIS — K5730 Diverticulosis of large intestine without perforation or abscess without bleeding: Secondary | ICD-10-CM

## 2023-12-21 DIAGNOSIS — D132 Benign neoplasm of duodenum: Secondary | ICD-10-CM

## 2023-12-21 DIAGNOSIS — K2980 Duodenitis without bleeding: Secondary | ICD-10-CM | POA: Diagnosis not present

## 2023-12-21 DIAGNOSIS — K6289 Other specified diseases of anus and rectum: Secondary | ICD-10-CM

## 2023-12-21 DIAGNOSIS — D124 Benign neoplasm of descending colon: Secondary | ICD-10-CM

## 2023-12-21 DIAGNOSIS — D509 Iron deficiency anemia, unspecified: Secondary | ICD-10-CM

## 2023-12-21 DIAGNOSIS — K31819 Angiodysplasia of stomach and duodenum without bleeding: Secondary | ICD-10-CM | POA: Diagnosis not present

## 2023-12-21 DIAGNOSIS — R1319 Other dysphagia: Secondary | ICD-10-CM

## 2023-12-21 MED ORDER — OMEPRAZOLE 40 MG PO CPDR: 40.0000 mg | DELAYED_RELEASE_CAPSULE | Freq: Two times a day (BID) | ORAL | 7 refills | Status: AC

## 2023-12-21 MED ORDER — SODIUM CHLORIDE 0.9 % IV SOLN: 500.0000 mL | INTRAVENOUS | Status: DC

## 2023-12-21 NOTE — Progress Notes (Signed)
1353 Robinul 0.1 mg IV given due large amount of secretions upon assessment.  MD made aware, vss 

## 2023-12-21 NOTE — Progress Notes (Signed)
 1418 Dr San called back to room due to low BP.  Pt aroused to commend and addition fluid being given.

## 2023-12-21 NOTE — Progress Notes (Signed)
 Called to room to assist during endoscopic procedure.  Patient ID and intended procedure confirmed with present staff. Received instructions for my participation in the procedure from the performing physician.

## 2023-12-21 NOTE — Progress Notes (Signed)
1410 Ephedrine 10 mg given IV due to low BP, MD updated.  

## 2023-12-21 NOTE — Progress Notes (Signed)
1400 Ephedrine 10 mg given IV due to low BP, MD updated.

## 2023-12-21 NOTE — Progress Notes (Signed)
1408 Ephedrine 10 mg given IV due to low BP, MD updated.

## 2023-12-21 NOTE — Progress Notes (Signed)
1340 Robinul 0.1 mg IV given due large amount of secretions upon assessment.  MD made aware, vss 

## 2023-12-21 NOTE — Progress Notes (Signed)
 Report given to PACU, vss

## 2023-12-21 NOTE — Op Note (Signed)
 [facility] Patient Name: Spencer Buckley Procedure Date: 12/21/2023 1:21 PM MRN: [id number] Endoscopist: Sandor Flatter , MD, [id number] Age: 58 Referring MD:  Date of Birth: Jun 02, 1965 Gender: Male Account #: [id number] Procedure:                Colonoscopy Indications:              High risk colon cancer surveillance: Personal                            history of colonic polyps                           Incidental - Iron deficiency anemia Medicines:                Monitored Anesthesia Care Procedure:                Pre-Anesthesia Assessment:                           - Prior to the procedure, a History and Physical                            was performed, and patient medications and                            allergies were reviewed. The patient's tolerance of                            previous anesthesia was also reviewed. The risks                            and benefits of the procedure and the sedation                            options and risks were discussed with the patient.                            All questions were answered, and informed consent                            was obtained. Prior Anticoagulants: The patient has                            taken no anticoagulant or antiplatelet agents. ASA                            Grade Assessment: II - A patient with mild systemic                            disease. After reviewing the risks and benefits,                            the patient was deemed in satisfactory condition to  undergo the procedure.                           After obtaining informed consent, the colonoscope                            was passed under direct vision. Throughout the                            procedure, the patient's blood pressure, pulse, and                            oxygen saturations were monitored continuously. The                            Olympus Scope SN: [id number] was introduced through                             the anus and advanced to the the terminal ileum.                            The terminal ileum, ileocecal valve, appendiceal                            orifice, and rectum were photographed. Scope In: 2:06:22 PM Scope Out: 2:17:21 PM Scope Withdrawal Time: 0 hours 9 minutes 14 seconds  Total Procedure Duration: 0 hours 10 minutes 59 seconds  Findings:                 The perianal and digital rectal examinations were                            normal.                           Two sessile polyps were found in the descending                            colon. The polyps were 3 to 4 mm in size. These                            polyps were removed with a cold snare. Resection                            and retrieval were complete. Estimated blood loss                            was minimal.                           Multiple small-mouthed diverticula were found in                            the ascending colon.  Non-bleeding internal hemorrhoids were found during                            retroflexion. The hemorrhoids were small.                           Anal papilla(e) were hypertrophied.                           The terminal ileum appeared normal. Complications:            No immediate complications. Estimated Blood Loss:     Estimated blood loss was minimal. Impression:               - Two 3 to 4 mm polyps in the descending colon,                            removed with a cold snare. Resected and retrieved.                           - Diverticulosis in the ascending colon.                           - Non-bleeding internal hemorrhoids.                           - Anal papilla(e) were hypertrophied.                           - The examined portion of the ileum was normal. Recommendation:           - Patient has a contact number available for                            emergencies. The signs and symptoms of potential                             delayed complications were discussed with the                            patient. Return to normal activities tomorrow.                            Written discharge instructions were provided to the                            patient.                           - Resume previous diet.                           - Continue present medications.                           - Await pathology results.                           -  Repeat colonoscopy for surveillance based on                            pathology results.                           - Return to GI office in 2-3 months. Sandor Flatter, MD 12/21/2023 2:41:45 PM

## 2023-12-21 NOTE — Progress Notes (Signed)
 GASTROENTEROLOGY PROCEDURE H&P NOTE   Primary Care Physician: Liu, Xiao F, PA-C (Inactive)    Reason for Procedure:  Abdominal pain, hematemesis, melena, dysphagia, colon polyp surveillance, iron deficiency anemia  Plan:    EGD, colonoscopy  Patient is appropriate for endoscopic procedure(s) in the ambulatory ([facility]) setting.  The nature of the procedure, as well as the risks, benefits, and alternatives were carefully and thoroughly reviewed with the patient. Ample time for discussion and questions allowed. The patient understood, was satisfied, and agreed to proceed. I personally addressed all patient questions and concerns.     HPI: Spencer Buckley is a 58 y.o. male who presents for EGD for evaluation of epigastric pain and recent UGI bleed with recent ER evaluation for hematemesis, MEG pain, and melena.  Was taking naproxen and ASA 81 mg.  Does have a history of intermittent solid food dysphagia.  No prior EGD.  Additionally, presents for colonoscopy for ongoing polyp surveillance.  Patient was most recently seen in the Gastroenterology Clinic on 11/27/2023.  Labs at that time notable for iron deficiency anemia with H/H 11.5/35, MCV/RDW 73.6/16, ferritin 7, iron 12, TIBC 546, sat 2.2%.  Recommended starting IV iron.  Please refer to that note for full details regarding GI history and clinical presentation.   Past Medical History:  Diagnosis Date   Anxiety    Arthritis    of facet joint of lumbar spine   Chest pain    Chronic headaches    Dysphagia    ED (erectile dysfunction)    Environmental and seasonal allergies    Hepatitis    Hep A in 80's   Hypercholesterolemia    Hypertension    Myalgia    SOB (shortness of breath)    Spinal stenosis    Vertigo    Vitamin D deficiency    Wears glasses     Past Surgical History:  Procedure Laterality Date   BACK SURGERY Right    CARDIAC CATHETERIZATION N/A 11/30/2015   Procedure: Left Heart Cath and Coronary Angiography;   Surgeon: Maude JAYSON Emmer, MD;  Location: [facility] INVASIVE CV LAB;  Service: Cardiovascular;  Laterality: N/A;   KNEE SURGERY     LEFT HEART CATH AND CORONARY ANGIOGRAPHY N/A 07/29/2020   Procedure: LEFT HEART CATH AND CORONARY ANGIOGRAPHY;  Surgeon: Verlin Lonni BIRCH, MD;  Location: [facility] INVASIVE CV LAB;  Service: Cardiovascular;  Laterality: N/A;   LUMBAR LAMINECTOMY/DECOMPRESSION MICRODISCECTOMY Right 07/04/2018   Procedure: Revision L5-S1 right gill decompression;  Surgeon: Burnetta Aures, MD;  Location: [facility] OR;  Service: Orthopedics;  Laterality: Right;  2.5 hrs   SHOULDER SURGERY      Prior to Admission medications  Medication Sig Start Date End Date Taking? Authorizing Provider  albuterol  (PROAIR  HFA) 108 (90 Base) MCG/ACT inhaler Inhale 2 puffs into the lungs every 6 (six) hours as needed for wheezing or shortness of breath. 12/16/15   Darlean Ozell NOVAK, MD  aspirin  EC 81 MG tablet Take 1 tablet (81 mg total) by mouth daily. Swallow whole. 07/29/20   Henry Manuelita NOVAK, NP  cetirizine (ZYRTEC) 10 MG tablet Take 10 mg by mouth daily as needed for allergies.    [provider]  colchicine  0.6 MG tablet Take 1 tablet (0.6 mg total) by mouth 2 (two) times daily for 5 days. 07/29/20 11/27/23  Henry Manuelita NOVAK, NP  furosemide  (LASIX ) 20 MG tablet TAKE 1 TABLET BY MOUTH EVERY DAY 08/21/20   Henry Manuelita B, NP  gabapentin  (NEURONTIN ) 400 MG  capsule Take 400 mg by mouth at bedtime.    [provider]  ondansetron  (ZOFRAN ) 4 MG tablet Take 1 tablet (4 mg total) by mouth every 8 (eight) hours as needed for nausea or vomiting. 07/04/18   Burnetta Aures, MD  OVER THE COUNTER MEDICATION Take 2 capsules by mouth daily. [employer], Historical, MD  oxyCODONE -acetaminophen  (PERCOCET) 10-325 MG tablet Take 1 tablet by mouth 4 (four) times daily as needed for pain.    [provider]  pantoprazole  (PROTONIX ) 40 MG tablet Take 1 tablet (40 mg total) by mouth 2 (two) times  daily. 11/27/23   May, Deanna J, NP  tetrahydrozoline-zinc (VISINE-AC) 0.05-0.25 % ophthalmic solution Place 2 drops into both eyes daily as needed (itchy eyes).    [provider]    Current Outpatient Medications  Medication Sig Dispense Refill   albuterol  (PROAIR  HFA) 108 (90 Base) MCG/ACT inhaler Inhale 2 puffs into the lungs every 6 (six) hours as needed for wheezing or shortness of breath. 1 Inhaler 3   aspirin  EC 81 MG tablet Take 1 tablet (81 mg total) by mouth daily. Swallow whole. 90 tablet 1   cetirizine (ZYRTEC) 10 MG tablet Take 10 mg by mouth daily as needed for allergies.     colchicine  0.6 MG tablet Take 1 tablet (0.6 mg total) by mouth 2 (two) times daily for 5 days. 10 tablet 0   furosemide  (LASIX ) 20 MG tablet TAKE 1 TABLET BY MOUTH EVERY DAY 30 tablet 0   gabapentin  (NEURONTIN ) 400 MG capsule Take 400 mg by mouth at bedtime.     ondansetron  (ZOFRAN ) 4 MG tablet Take 1 tablet (4 mg total) by mouth every 8 (eight) hours as needed for nausea or vomiting. 20 tablet 0   OVER THE COUNTER MEDICATION Take 2 capsules by mouth daily. [employer]     oxyCODONE -acetaminophen  (PERCOCET) 10-325 MG tablet Take 1 tablet by mouth 4 (four) times daily as needed for pain.     pantoprazole  (PROTONIX ) 40 MG tablet Take 1 tablet (40 mg total) by mouth 2 (two) times daily. 180 tablet 3   tetrahydrozoline-zinc (VISINE-AC) 0.05-0.25 % ophthalmic solution Place 2 drops into both eyes daily as needed (itchy eyes).     No current facility-administered medications for this visit.    Allergies as of 12/21/2023   (No Known Allergies)    Family History  Problem Relation Age of Onset   Hypertension Mother    Hypertension Father    Hyperlipidemia Father     Social History   Socioeconomic History   Marital status: Legally Separated    Spouse name: Not on file   Number of children: Not on file   Years of education: Not on file   Highest education level: Not on file  Occupational  History   Not on file  Tobacco Use   Smoking status: Never   Smokeless tobacco: Never  Vaping Use   Vaping status: Never Used  Substance and Sexual Activity   Alcohol use: Yes    Alcohol/week: 0.0 standard drinks of alcohol    Comment: occ   Drug use: No   Sexual activity: Not on file  Other Topics Concern   Not on file  Social History Narrative   Not on file   Social Drivers of Health   Tobacco Use: Low Risk (11/27/2023)   Patient History    Smoking Tobacco Use: Never    Smokeless Tobacco Use: Never  Passive Exposure: Not on file  Financial Resource Strain: Not on file  Food Insecurity: Low Risk (04/19/2022)   Received from [facility]   Epic    Within the past 12 months, you worried that your food would run out before you got money to buy more: Never true    Within the past 12 months, the food you bought just didn't last and you didn't have money to get more. : Never true  Transportation Needs: No Transportation Needs (04/19/2022)   Received from [facility]    In the past 12 months, has lack of reliable transportation kept you from medical appointments, meetings, work or from getting things needed for daily living? : No  Physical Activity: Not on file  Stress: Not on file  Social Connections: Not on file  Intimate Partner Violence: Not on file  Depression (EYV7-0): Not on file  Alcohol Screen: Not on file  Housing: Unknown (11/17/2023)   Received from [facility]   Epic    Unable to Pay for Housing in the Last Year: Not on file    Number of Times Moved in the Last Year: Not on file    At any time in the past 12 months, were you homeless or living in a shelter (including now)?: No  Utilities: Low Risk (04/19/2022)   Received from [facility]   Utilities    In the past 12 months has the electric, gas, oil, or water company threatened to shut off services in your home? : No  Health Literacy: Not on file    Physical  Exam: Vital signs in last 24 hours: @There  were no vitals taken for this visit. GEN: NAD EYE: Sclerae anicteric ENT: MMM CV: Non-tachycardic Pulm: CTA b/l GI: Soft, NT/ND NEURO:  Alert & Oriented x 3   Sandor Flatter, DO [facility]   12/21/2023 1:12 PM

## 2023-12-21 NOTE — Op Note (Signed)
 [facility] Patient Name: Spencer Buckley Procedure Date: 12/21/2023 1:21 PM MRN: [id number] Endoscopist: Sandor Flatter , MD, [id number] Age: 58 Referring MD:  Date of Birth: 06/05/1965 Gender: Male Account #: [id number] Procedure:                Upper GI endoscopy Indications:              Epigastric abdominal pain, Iron deficiency anemia,                            Dysphagia, Recent ER evaluation for hematemesis and                            melena Medicines:                Monitored Anesthesia Care Procedure:                Pre-Anesthesia Assessment:                           - Prior to the procedure, a History and Physical                            was performed, and patient medications and                            allergies were reviewed. The patient's tolerance of                            previous anesthesia was also reviewed. The risks                            and benefits of the procedure and the sedation                            options and risks were discussed with the patient.                            All questions were answered, and informed consent                            was obtained. Prior Anticoagulants: The patient has                            taken no anticoagulant or antiplatelet agents. ASA                            Grade Assessment: II - A patient with mild systemic                            disease. After reviewing the risks and benefits,                            the patient was deemed in satisfactory condition to  undergo the procedure.                           After obtaining informed consent, the endoscope was                            passed under direct vision. Throughout the                            procedure, the patient's blood pressure, pulse, and                            oxygen saturations were monitored continuously. The                            GIF [id number] #[id number] was introduced through  the                            mouth, and advanced to the second part of duodenum.                            The upper GI endoscopy was accomplished without                            difficulty. The patient tolerated the procedure                            well. Scope In: Scope Out: Findings:                 Two benign-appearing, intrinsic mild stenoses were                            found in the lower third of the esophagus. The                            stenoses were traversed. A TTS dilator was passed                            through the scope. Dilation with an 18-19-20 mm                            balloon dilator was performed to 20 mm. The                            dilation site was examined and showed no bleeding,                            mucosal tear or perforation. Estimated blood loss:                            none.                           One superficial esophageal ulcer with no bleeding  and no stigmata of recent bleeding was found at the                            gastroesophageal junction. The lesion was 3 mm in                            largest dimension.                           A single 3 mm sessile polyp with no stigmata of                            recent bleeding was found in the cardia/GE                            junction. The polyp was removed with a cold biopsy                            forceps. Resection and retrieval were complete.                            Estimated blood loss was minimal.                           A single 4 mm angioectasia with no bleeding was                            found in the gastric antrum. Coagulation for                            hemostasis using snare tip was successful.                            Estimated blood loss: none.                           Scattered mild inflammation characterized by                            congestion (edema) and erythema was found in the                             gastric body and in the gastric antrum. Biopsies                            were taken with a cold forceps for Helicobacter                            pylori testing. Estimated blood loss was minimal.                           A single 4 mm sessile polyp with no bleeding was  found in the second portion of the duodenum. The                            polyp was removed with a cold snare. Resection and                            retrieval were complete. Estimated blood loss was                            minimal.                           Normal mucosa was found in the duodenal bulb, in                            the first portion of the duodenum and in the second                            portion of the duodenum. Biopsies were taken with a                            cold forceps for histology. Estimated blood loss                            was minimal. Complications:            No immediate complications. Estimated Blood Loss:     Estimated blood loss was minimal. Impression:               - Benign-appearing esophageal stenoses. Dilated                            with 20 mm TTS balloon.                           - Esophageal ulcer with no bleeding and no stigmata                            of recent bleeding. While this could be a feature                            mild erosive esophagitis from reflux, based on                            location and clinical history, I am more suspicious                            of a healing Mallory-Weiss tear. This did not                            require any additional endoscopic intervention.                            Will plan to treat with high-dose PPI therapy as  below.                           - A single gastric polyp located at the                            gastroesophageal junction/gastric cardia. Resected                            and retrieved.                           -  A single non-bleeding angioectasia in the antral                            stomach. Treated with a hot snare tip.                           - Mild non-ulcer gastritis. Biopsied.                           - A single duodenal polyp. Resected and retrieved.                           - Normal mucosa was found in the duodenal bulb, in                            the first portion of the duodenum and in the second                            portion of the duodenum. Biopsied. Recommendation:           - Patient has a contact number available for                            emergencies. The signs and symptoms of potential                            delayed complications were discussed with the                            patient. Return to normal activities tomorrow.                            Written discharge instructions were provided to the                            patient.                           - Resume previous diet.                           - Continue present medications.                           -  Await pathology results.                           - Use Prilosec (omeprazole ) 40 mg PO BID for 4                            weeks to promote mucosal healing of gastritis and                            ulcer at the GE junction. After 4 weeks, if upper                            GI symptoms have resolved, can reduce to 40 mg                            daily.                           - Follow-up in the GI clinic in 2-3 months or                            sooner as needed.                           - Repeat CBC and iron panel in 2 months..                           - Perform a colonoscopy today. Sandor Flatter, MD 12/21/2023 2:38:26 PM

## 2023-12-21 NOTE — Patient Instructions (Addendum)
 - Use Prilosec (omeprazole ) 40 mg PO BID for 4   weeks to promote mucosal healing of gastritis and   ulcer at the GE junction. After 4 weeks, if upper     GI symptoms have resolved, can reduce to 40 mg     daily.  - 2 polyps removed ans sent to pathology, diverticulosis and hemorrhoids                           - Repeat CBC and iron panel in 2 months..  - Resume previous diet. - Continue present medications.  - Await pathology results. - Repeat colonoscopy for surveillance based on   pathology results. - Return to GI office in 2-3 months.  YOU HAD AN ENDOSCOPIC PROCEDURE TODAY AT THE [facility]:   Refer to the procedure report that was given to you for any specific questions about what was found during the examination.  If the procedure report does not answer your questions, please call your gastroenterologist to clarify.  If you requested that your care partner not be given the details of your procedure findings, then the procedure report has been included in a sealed envelope for you to review at your convenience later.  YOU SHOULD EXPECT: Some feelings of bloating in the abdomen. Passage of more gas than usual.  Walking can help get rid of the air that was put into your GI tract during the procedure and reduce the bloating. If you had a lower endoscopy (such as a colonoscopy or flexible sigmoidoscopy) you may notice spotting of blood in your stool or on the toilet paper. If you underwent a bowel prep for your procedure, you may not have a normal bowel movement for a few days.  Please Note:  You might notice some irritation and congestion in your nose or some drainage.  This is from the oxygen used during your procedure.  There is no need for concern and it should clear up in a day or so.  SYMPTOMS TO REPORT IMMEDIATELY:  Following lower endoscopy (colonoscopy or flexible sigmoidoscopy):  Excessive amounts of blood in the stool  Significant tenderness or worsening of  abdominal pains  Swelling of the abdomen that is new, acute  Fever of 100F or higher  Following upper endoscopy (EGD)  Vomiting of blood or coffee ground material  New chest pain or pain under the shoulder blades  Painful or persistently difficult swallowing  New shortness of breath  Fever of 100F or higher  Black, tarry-looking stools  For urgent or emergent issues, a gastroenterologist can be reached at any hour by calling [phone]. Do not use MyChart messaging for urgent concerns.    DIET:  We do recommend a small meal at first, but then you may proceed to your regular diet.  Drink plenty of fluids but you should avoid alcoholic beverages for 24 hours.  ACTIVITY:  You should plan to take it easy for the rest of today and you should NOT DRIVE or use heavy machinery until tomorrow (because of the sedation medicines used during the test).    FOLLOW UP: Our staff will call the number listed on your records the next business day following your procedure.  We will call around 7:15- 8:00 am to check on you and address any questions or concerns that you may have regarding the information given to you following your procedure. If we do not reach you, we will leave a message.  If any biopsies were taken you will be contacted by phone or by letter within the next 1-3 weeks.  Please call us  at [phone] if you have not heard about the biopsies in 3 weeks.    SIGNATURES/CONFIDENTIALITY: You and/or your care partner have signed paperwork which will be entered into your electronic medical record.  These signatures attest to the fact that that the information above on your After Visit Summary has been reviewed and is understood.  Full responsibility of the confidentiality of this discharge information lies with you and/or your care-partner.

## 2023-12-22 NOTE — Telephone Encounter (Signed)
" °  Follow up Call-     12/21/2023    1:25 PM  Call back number  Post procedure Call Back phone  # [phone]  Permission to leave phone message Yes     Patient questions:  Do you have a fever, pain , or abdominal swelling? No. Pain Score  0 *  Have you tolerated food without any problems? Yes.    Have you been able to return to your normal activities? Yes.    Do you have any questions about your discharge instructions: Diet   No. Medications  No. Follow up visit  No.  Do you have questions or concerns about your Care? No.  Actions: * If pain score is 4 or above: No action needed, pain <4.   "

## 2023-12-26 LAB — SURGICAL PATHOLOGY

## 2024-01-03 ENCOUNTER — Ambulatory Visit: Payer: Self-pay | Admitting: Gastroenterology

## 2024-02-22 ENCOUNTER — Ambulatory Visit: Admitting: Gastroenterology
